# Patient Record
Sex: Male | Born: 1990 | Race: White | Hispanic: No | Marital: Married | Smoking: Never smoker
Health system: Southern US, Community
[De-identification: ages and names within clinical notes are randomized; demographics above are authoritative.]

## PROBLEM LIST (undated history)

## (undated) DIAGNOSIS — F419 Anxiety disorder, unspecified: Secondary | ICD-10-CM

## (undated) DIAGNOSIS — F32A Depression, unspecified: Secondary | ICD-10-CM

## (undated) DIAGNOSIS — U071 COVID-19: Secondary | ICD-10-CM

## (undated) HISTORY — DX: Depression, unspecified: F32.A

## (undated) HISTORY — DX: Anxiety disorder, unspecified: F41.9

---
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
## 2010-12-17 HISTORY — PX: WRIST SURGERY: SHX841

## 2011-01-07 ENCOUNTER — Encounter: Payer: Self-pay | Admitting: Specialist

## 2016-06-07 DIAGNOSIS — F4321 Adjustment disorder with depressed mood: Secondary | ICD-10-CM | POA: Insufficient documentation

## 2016-06-27 ENCOUNTER — Inpatient Hospital Stay (HOSPITAL_COMMUNITY)
Admission: AD | Admit: 2016-06-27 | Discharge: 2016-07-01 | DRG: 885 | Disposition: A | Discharge status: Home / Self Care | Payer: BLUE CROSS/BLUE SHIELD | Source: Intra-hospital | Attending: Psychiatry | Admitting: Psychiatry

## 2016-06-27 ENCOUNTER — Encounter (HOSPITAL_COMMUNITY): Payer: Self-pay | Admitting: Emergency Medicine

## 2016-06-27 ENCOUNTER — Emergency Department (HOSPITAL_COMMUNITY)
Admission: EM | Admit: 2016-06-27 | Discharge: 2016-06-27 | Disposition: A | Discharge status: Other Acute Inpatient Hospital | Payer: BLUE CROSS/BLUE SHIELD | Attending: Emergency Medicine | Admitting: Emergency Medicine

## 2016-06-27 ENCOUNTER — Encounter (HOSPITAL_COMMUNITY): Payer: Self-pay

## 2016-06-27 DIAGNOSIS — Z79899 Other long term (current) drug therapy: Secondary | ICD-10-CM | POA: Insufficient documentation

## 2016-06-27 DIAGNOSIS — R45851 Suicidal ideations: Secondary | ICD-10-CM | POA: Diagnosis present

## 2016-06-27 DIAGNOSIS — F332 Major depressive disorder, recurrent severe without psychotic features: Secondary | ICD-10-CM | POA: Diagnosis present

## 2016-06-27 DIAGNOSIS — F322 Major depressive disorder, single episode, severe without psychotic features: Secondary | ICD-10-CM | POA: Diagnosis present

## 2016-06-27 DIAGNOSIS — F329 Major depressive disorder, single episode, unspecified: Secondary | ICD-10-CM | POA: Diagnosis present

## 2016-06-27 DIAGNOSIS — F32A Depression, unspecified: Secondary | ICD-10-CM

## 2016-06-27 LAB — COMPREHENSIVE METABOLIC PANEL
ALT: 18 U/L (ref 17–63)
ANION GAP: 7 (ref 5–15)
AST: 17 U/L (ref 15–41)
Albumin: 5 g/dL (ref 3.5–5.0)
Alkaline Phosphatase: 44 U/L (ref 38–126)
BUN: 12 mg/dL (ref 6–20)
CHLORIDE: 105 mmol/L (ref 101–111)
CO2: 26 mmol/L (ref 22–32)
Calcium: 9.8 mg/dL (ref 8.9–10.3)
Creatinine, Ser: 0.9 mg/dL (ref 0.61–1.24)
GFR calc Af Amer: >60 mL/min (ref >60)
GFR calc non Af Amer: >60 mL/min (ref >60)
Glucose, Bld: 87 mg/dL (ref 65–99)
Potassium: 4.6 mmol/L (ref 3.5–5.1)
Sodium: 138 mmol/L (ref 135–145)
Total Bilirubin: 1.1 mg/dL (ref 0.3–1.2)
Total Protein: 7.9 g/dL (ref 6.5–8.1)

## 2016-06-27 LAB — RAPID URINE DRUG SCREEN, HOSP PERFORMED
AMPHETAMINES: NOT DETECTED
Barbiturates: NOT DETECTED
Benzodiazepines: NOT DETECTED
Cocaine: NOT DETECTED
Opiates: NOT DETECTED
TETRAHYDROCANNABINOL: NOT DETECTED

## 2016-06-27 LAB — SALICYLATE LEVEL: Salicylate Lvl: <4 mg/dL (ref 2.8–30.0)

## 2016-06-27 LAB — ETHANOL: Alcohol, Ethyl (B): <5 mg/dL (ref <5)

## 2016-06-27 LAB — ACETAMINOPHEN LEVEL: Acetaminophen (Tylenol), Serum: <10 ug/mL — ABNORMAL LOW (ref 10–30)

## 2016-06-27 MED ORDER — ESCITALOPRAM OXALATE 10 MG PO TABS
10.0000 mg | ORAL_TABLET | Freq: Every day | ORAL | Status: DC
  Administered 2016-06-28: 10 mg via ORAL
  Filled 2016-06-27 (×3): qty 1

## 2016-06-27 MED ORDER — TRAZODONE HCL 50 MG PO TABS
50.0000 mg | ORAL_TABLET | Freq: Every evening | ORAL | Status: DC | PRN
  Administered 2016-06-27 – 2016-06-30 (×5): 50 mg via ORAL
  Filled 2016-06-27 (×13): qty 1

## 2016-06-27 MED ORDER — ALUM & MAG HYDROXIDE-SIMETH 200-200-20 MG/5ML PO SUSP: 30.0000 mL | ORAL | Status: DC | PRN

## 2016-06-27 MED ORDER — MAGNESIUM HYDROXIDE 400 MG/5ML PO SUSP: 30.0000 mL | Freq: Every day | ORAL | Status: DC | PRN

## 2016-06-27 MED ORDER — TRAZODONE HCL 50 MG PO TABS
50.0000 mg | ORAL_TABLET | Freq: Every evening | ORAL | Status: DC | PRN
Start: 2016-06-28 — End: 2016-06-27

## 2016-06-27 MED ORDER — HYDROXYZINE HCL 25 MG PO TABS
25.0000 mg | ORAL_TABLET | Freq: Four times a day (QID) | ORAL | Status: DC | PRN
  Administered 2016-06-27 – 2016-06-30 (×4): 25 mg via ORAL
  Filled 2016-06-27 (×4): qty 1

## 2016-06-27 MED ORDER — ACETAMINOPHEN 325 MG PO TABS: 650.0000 mg | ORAL_TABLET | Freq: Four times a day (QID) | ORAL | Status: DC | PRN

## 2016-06-27 NOTE — BH Assessment (Addendum)
Tele Assessment Note   Charles Haynes is an 25 y.o. male.  -Clinician reviewed note by Dr. Rubin Payor.  Patient has been depressed for the last two months since breaking up with a girlfriend.  Reportedly she has been sleeping with other men since the breakup.  Patient has been on Lexapro for about a month now.  He has not had his dose today.  Pt told his mother that he was going to hang himself and he told nurse that he wanted to kill himself.   Mother said that patient had told his mother "I'm going to hang myself" about five times today.  He told mother that he had researched it and knew how to do it and that he was going to do it tonight.  Patient admits to saying these things.  He said he was suicidal.    Patient broke up w/ girlfriend two months ago.  He said he had planned to propose in August but something told him to hold off on the relationship.  Girlfriend wanted to move forward with the relationship but he did not so they split up.  Patient has been upset that she found someone quickly and reportedly has slept with this other person.  Pt lives in [facility] [place] (outside [place]) and has driven from there to [place] to see her and has done this about 5 times.  Patient over the last week or more has been saying he wants to die.  Today he saw a picture of the girlfriend and her new boyfriend on social media.  After that he said he was going to hang himself.  Patient used to work out a lot and exercise but for the last week he has lost his motivation.  Pt reports sleeping a lot and not wanting to do much.  He said that he thinks about the relationship constantly.  Patient denies use of ETOH or illicit drugs.  He is a Gaffer at [employer].  Pt has been depressed before about relationships.  He has had no inpatient psychiatric care history.  Patient did have some pastoral counseling about two years ago following a relationship breakup.  -Clinician discussed patient care with  Donell Sievert, PA.  He accepted patient to Dr. Jama Flavors.  Pt to go to [facility].  Patient disposition given to Dr. Rubin Payor.  Patient signed voluntary admission papers, they were faxed to [facility].  Nurse Autumn will contact [facility] and give report.  Diagnosis: MDD single episode, severe w/o psychosis  Past Medical History: History reviewed. No pertinent past medical history.  Past Surgical History  Procedure Laterality Date  . Knee surgery Left 2009  . Wrist surgery Right 2012    Family History: History reviewed. No pertinent family history.  Social History:  reports that he has never smoked. He does not have any smokeless tobacco history on file. He reports that he does not drink alcohol or use illicit drugs.  Additional Social History:  Alcohol / Drug Use Pain Medications: None Prescriptions: Lexapro 10mg  once daily at 17:30.  Has not taken today.  Pt began with this on 06/21 History of alcohol / drug use?: No history of alcohol / drug abuse  CIWA: CIWA-Ar BP: 118/78 mmHg Pulse Rate: 81 COWS:    PATIENT STRENGTHS: (choose at least two) Ability for insight Average or above average intelligence Capable of independent living Communication skills Supportive family/friends  Allergies: No Known Allergies  Home Medications:  (Not in a hospital admission)  OB/GYN Status:  No  LMP for male patient.  General Assessment Data [facility]: [facility] TTS Assessment: In system Is this a Tele or Face-to-Face Assessment?: Face-to-Face Is this an Initial Assessment or a Re-assessment for this encounter?: Initial Assessment Marital status: Single Is patient pregnant?: No Pregnancy Status: No Living Arrangements: Non-relatives/Friends ([place] Can pt return to current living arrangement?: Yes Admission Status: Voluntary Is patient capable of signing voluntary admission?: Yes Referral Source: Self/Family/Friend (Mother brought [facility] to [facility]) Insurance  type: BC/BS     Crisis Care Plan Living Arrangements: Non-relatives/Friends ([place] Name of Psychiatrist: None (Family provider is prescribing) Name of Therapist: None  Education Status Is patient currently in school?: Yes Current Grade: GafferGraduate student Highest grade of school patient has completed: Energy managerBachelor's  Name of school: [employer] Contact person: patient  Risk to self with the past 6 months Suicidal Ideation: Yes-Currently Present Has patient been a risk to self within the past 6 months prior to admission? : Yes Suicidal Intent: Yes-Currently Present Has patient had any suicidal intent within the past 6 months prior to admission? : Yes Is patient at risk for suicide?: Yes Suicidal Plan?: Yes-Currently Present Has patient had any suicidal plan within the past 6 months prior to admission? : Yes Specify Current Suicidal Plan: Hang self (had researched how to do it) Access to Means: Yes Specify Access to Suicidal Means: Rope at home What has been your use of drugs/alcohol within the last 12 months?: None Previous Attempts/Gestures: No How many times?: 0 Other Self Harm Risks: None Triggers for Past Attempts: None known Intentional Self Injurious Behavior: None Family Suicide History: No Recent stressful life event(s): Loss (Comment) (Broke up w/ gf two months ago.) Persecutory voices/beliefs?: No Depression: Yes Depression Symptoms: Despondent, Tearfulness, Loss of interest in usual pleasures, Feeling worthless/self pity, Fatigue, Isolating Substance abuse history and/or treatment for substance abuse?: No Suicide prevention information given to non-admitted patients: Not applicable  Risk to Others within the past 6 months Homicidal Ideation: No Does patient have any lifetime risk of violence toward others beyond the six months prior to admission? : No Thoughts of Harm to Others: No Current Homicidal Intent: No Current  Homicidal Plan: No Access to Homicidal Means: No Identified Victim: No one History of harm to others?: No Assessment of Violence: None Noted Violent Behavior Description: None noted Does patient have access to weapons?: No Criminal Charges Pending?: No Does patient have a court date: No Is patient on probation?: No  Psychosis Hallucinations: None noted Delusions: None noted  Mental Status Report Appearance/Hygiene: Unremarkable, In scrubs Eye Contact: Good Motor Activity: Freedom of movement, Unremarkable Speech: Soft Level of Consciousness: Alert Mood: Depressed, Helpless, Sad, Anxious Affect: Blunted, Depressed, Sad Anxiety Level: Moderate Thought Processes: Coherent, Relevant Judgement: Unimpaired Orientation: Person, Place, Time, Situation Obsessive Compulsive Thoughts/Behaviors: Severe (constantly thinking about former girlfriend)  Cognitive Functioning Concentration: Decreased Memory: Recent Intact, Remote Intact IQ: Average Insight: Good Impulse Control: Fair Appetite: Fair Weight Loss: 0 Weight Gain: 0 Sleep: Increased Total Hours of Sleep:  (10-12 hours per day.  Naps, full night sleep.) Vegetative Symptoms: Staying in bed, Decreased grooming  ADLScreening [facility] Assessment Services) Patient's cognitive ability adequate to safely complete daily activities?: Yes Patient able to express need for assistance with ADLs?: Yes Independently performs ADLs?: Yes (appropriate for developmental age)  Prior Inpatient Therapy Prior Inpatient Therapy: No Prior Therapy Dates: N/A Prior Therapy Facilty/Provider(s): N/A Reason for Treatment: N/A  Prior  Outpatient Therapy Prior Outpatient Therapy: Yes Prior Therapy Dates: couple of years ago Prior Therapy Facilty/Provider(s): Pastoral counseling Reason for Treatment: depression Does patient have an ACCT team?: No Does patient have Intensive In-House Services?  : No Does patient have [facility] services? : No Does  patient have P4CC services?: No  ADL Screening (condition at time of admission) Patient's cognitive ability adequate to safely complete daily activities?: Yes Is the patient deaf or have difficulty hearing?: No Does the patient have difficulty seeing, even when wearing glasses/contacts?: No Does the patient have difficulty concentrating, remembering, or making decisions?: No Patient able to express need for assistance with ADLs?: Yes Does the patient have difficulty dressing or bathing?: No Independently performs ADLs?: Yes (appropriate for developmental age) Does the patient have difficulty walking or climbing stairs?: No Weakness of Legs: None Weakness of Arms/Hands: None       Abuse/Neglect Assessment (Assessment to be complete while patient is alone) Physical Abuse: Denies Verbal Abuse: Denies Sexual Abuse: Denies Exploitation of patient/patient's resources: Denies Self-Neglect: Denies     Merchant navy officer (For Healthcare) Does patient have an advance directive?: No Would patient like information on creating an advanced directive?: No - patient declined information    Additional Information 1:1 In Past 12 Months?: No CIRT Risk: No Elopement Risk: No Does patient have medical clearance?: Yes     Disposition:  Disposition Initial Assessment Completed for this Encounter: Yes Disposition of Patient: Inpatient treatment program, Referred to Type of inpatient treatment program: Adult Patient referred to: Other (Comment) (To be reviewed with PA)  Beatriz Stallion Ray 06/27/2016 9:01 PM

## 2016-06-27 NOTE — Tx Team (Signed)
Initial Interdisciplinary Treatment Plan   PATIENT STRESSORS: Loss of Girlfriend   PATIENT STRENGTHS: Ability for insight Average or above average intelligence Capable of independent living Financial means General fund of knowledge   PROBLEM LIST: Problem List/Patient Goals Date to be addressed Date deferred Reason deferred Estimated date of resolution  Depression 06/27/16     Suicidal ideation 06/27/16     "I want to feel better" 06/27/16     "Be able to move on" 06/27/16                                    DISCHARGE CRITERIA:  Improved stabilization in mood, thinking, and/or behavior Verbal commitment to aftercare and medication compliance  PRELIMINARY DISCHARGE PLAN: Outpatient therapy Medication management  PATIENT/FAMIILY INVOLVEMENT: This treatment plan has been presented to and reviewed with the patient, Charles Haynes.  The patient and family have been given the opportunity to ask questions and make suggestions.  Norm ParcelHeather V Lilah Mijangos 06/27/2016, 11:09 PM

## 2016-06-27 NOTE — ED Notes (Signed)
Pt here for SI- brought by mother for SI. Mother states pt said to her today that he was going to hang himself and that he looked it up and knows how, and 5 mins prior to arrival of this RN pt said to mother "I am going to kill myself tonight and I am not going to tell you how and says he said it 5x today.  Pt takes Lexapro daily but did not have dose today. At triage pt with little words, cooperative but wants the mother to talk.

## 2016-06-27 NOTE — ED Provider Notes (Addendum)
CSN: [id number]     Arrival date & time 06/27/16  1846 History   First MD Initiated Contact with Patient 06/27/16 1918     Chief Complaint  Patient presents with  . Suicidal      The history is provided by the patient.  Patient was brought in with his mother for being suicidal. Reportedly has been depressed over the last 2 months since breaking up with a girlfriend. Reportedly she has been sleeping around with other men since then. He has been on Lexapro for around a month now. His continued to get worse over last few days and become suicidal. He is told his mother that he is suicidal. Told her that he would hang himself. He reported to me that he was suicidal. No history of depression before this. Denies substance abuse. He has reportedly in the seminary.  History reviewed. No pertinent past medical history. Past Surgical History  Procedure Laterality Date  . Knee surgery Left 2009  . Wrist surgery Right 2012   History reviewed. No pertinent family history. Social History  Substance Use Topics  . Smoking status: Never Smoker   . Smokeless tobacco: None  . Alcohol Use: No    Review of Systems  Constitutional: Negative for activity change and appetite change.  Eyes: Negative for pain.  Respiratory: Negative for chest tightness and shortness of breath.   Cardiovascular: Negative for chest pain and leg swelling.  Gastrointestinal: Negative for nausea, vomiting, abdominal pain and diarrhea.  Genitourinary: Negative for flank pain.  Musculoskeletal: Negative for back pain and neck stiffness.  Skin: Negative for rash.  Neurological: Negative for weakness, numbness and headaches.  Psychiatric/Behavioral: Positive for suicidal ideas and dysphoric mood. Negative for behavioral problems.      Allergies  Review of patient's allergies indicates no known allergies.  Home Medications   Prior to Admission medications   Not on File   BP 118/78 mmHg  Pulse 81  Temp(Src) 97.8 F  (36.6 C) (Oral)  Resp 16  Ht 5\' 11"  (1.803 m)  Wt 165 lb (74.844 kg)  BMI 23.02 kg/m2  SpO2 100% Physical Exam  Constitutional: He appears well-developed.  HENT:  Head: Atraumatic.  Eyes: EOM are normal.  Cardiovascular: Normal rate.   Pulmonary/Chest: Effort normal.  Abdominal: Soft.  Musculoskeletal: He exhibits no edema.  Neurological: He is alert.  Skin: Skin is warm.  Psychiatric:  Patient appear depressed    ED Course  Procedures (including critical care time) Labs Review Labs Reviewed  COMPREHENSIVE METABOLIC PANEL  ETHANOL  ACETAMINOPHEN LEVEL  SALICYLATE LEVEL  URINE RAPID DRUG SCREEN, HOSP PERFORMED    Imaging Review No results found. I have personally reviewed and evaluated these images and lab results as part of my medical decision-making.   EKG Interpretation None      MDM   Final diagnoses:  Suicidal ideations  Depression    Patient was brought in with his mother. He is depressed and suicidal since the breakup. Plan to hang himself. He is voluntary at this time but I would involuntary commitment he attempts to leave. Lab work pending but will likely be medically cleared. To be seen by TTS.     Benjiman CoreNathan Rahima Fleishman, MD 06/27/16 1936  Patient is transected at behavioral health by Dr. Jama Flavorsobos.  Benjiman CoreNathan Obaloluwa Delatte, MD 06/27/16 2119

## 2016-06-27 NOTE — Progress Notes (Signed)
Charles Haynes is a 25 year old male being [facility].  He was brought in with his mother for being suicidal. Reportedly has been depressed over the last 2 months since breaking up with a girlfriend. Reportedly she has been sleeping around with other men since then. He has been on Lexapro for around a month now and has continued to get worse over last few days. He told his mother that he would hang himself tonight.  No reports of psychiatric history. He denies substance abuse. 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  He continues to report suicidal ideation but is able to contract for safety on the unit.  He denies A/V hallucinations.  He denies medical problems.  Admission paperwork completed and signed.  Belongings searched and no belongs to secure in a locker.  Skin assessment completed and noted right wrist surgery scar and left knee surgery scar.  No other skin issues noted.  Q 15 minute checks initiated for safety.  We will monitor the progress towards his goals.]

## 2016-06-28 DIAGNOSIS — F332 Major depressive disorder, recurrent severe without psychotic features: Principal | ICD-10-CM

## 2016-06-28 DIAGNOSIS — R45851 Suicidal ideations: Secondary | ICD-10-CM | POA: Insufficient documentation

## 2016-06-28 MED ORDER — BUPROPION HCL ER (XL) 150 MG PO TB24
150.0000 mg | ORAL_TABLET | Freq: Every day | ORAL | Status: DC
  Administered 2016-06-28 – 2016-07-01 (×4): 150 mg via ORAL
  Filled 2016-06-28 (×7): qty 1

## 2016-06-28 MED ORDER — ONDANSETRON 4 MG PO TBDP
4.0000 mg | ORAL_TABLET | Freq: Three times a day (TID) | ORAL | Status: DC | PRN
  Administered 2016-06-28: 4 mg via ORAL
  Filled 2016-06-28: qty 1

## 2016-06-28 NOTE — [facility] Group Notes (Signed)
[facility] Group Notes:  (Nursing/MHT/Case Management/Adjunct)  Date:  06/28/2016  Time:  9:11 AM  Type of Therapy:  Orientation Group  Participation Level:  Did Not Attend  Patient invited; declined to attend.  Cranford MonBeaudry, Amory Simonetti Evans 06/28/2016, 9:11 AM

## 2016-06-28 NOTE — BHH Suicide Risk Assessment (Signed)
[facility] Admission Suicide Risk Assessment   Nursing information obtained from:  Patient Demographic factors:  Male, Adolescent or young adult, Caucasian Current Mental Status:  Suicidal ideation indicated by patient, Suicide plan Loss Factors:  Loss of significant relationship Historical Factors:  Impulsivity Risk Reduction Factors:  Employed, Religious beliefs about death, Positive social support  Total Time spent with patient: 45 minutes Principal Problem: <principal problem not specified> Diagnosis:   Patient Active Problem List   Diagnosis Date Noted  . MDD (major depressive disorder), single episode, severe (HCC) [F32.2] 06/27/2016   Subjective Data: Patient presented with severe depression and anxiety x 2 months and made several verbal threats to his parents and has intention and plan of hanging himself due to break up with his girl friend who has moved to other relationship and he still feels love with her. He has started Lexapro by a PCP and not helpful.   Continued Clinical Symptoms:  Alcohol Use Disorder Identification Test Final Score (AUDIT): 0 The "Alcohol Use Disorders Identification Test", Guidelines for Use in Primary Care, Second Edition.  World Science writer [facility]). Score between 0-7:  no or low risk or alcohol related problems. Score between 8-15:  moderate risk of alcohol related problems. Score between 16-19:  high risk of alcohol related problems. Score 20 or above:  warrants further diagnostic evaluation for alcohol dependence and treatment.   CLINICAL FACTORS:   Depression:   Anhedonia Hopelessness Impulsivity Insomnia Recent sense of peace/wellbeing Severe Unstable or Poor Therapeutic Relationship Previous Psychiatric Diagnoses and Treatments   Musculoskeletal: Strength & Muscle Tone: within normal limits Gait & Station: normal Patient leans: N/A  Psychiatric Specialty Exam: Physical Exam as per history and physical  ROS depression, loss of  interest and focus. Disturbed sleep and appetite. Denied chest pain and sob.  No Fever-chills, No Headache, No changes with Vision or hearing, reports vertigo No problems swallowing food or Liquids, No Chest pain, Cough or Shortness of Breath, No Abdominal pain, No Nausea or Vommitting, Bowel movements are regular, No Blood in stool or Urine, No dysuria, No new skin rashes or bruises, No new joints pains-aches,  No new weakness, tingling, numbness in any extremity, No recent weight gain or loss, No polyuria, polydypsia or polyphagia,  A full 10 point Review of Systems was done, except as stated above, all other Review of Systems were negative.  Blood pressure 99/59, pulse 91, temperature 98.1 F (36.7 C), temperature source Oral, resp. rate 16, height  (1.803 m), weight 74.844 kg (165 lb), SpO2 100 %.Body mass index is 23.02 kg/(m^2).  General Appearance: Guarded  Eye Contact:  Good  Speech:  Slow  Volume:  Decreased  Mood:  Anxious, Depressed, Hopeless and Worthless  Affect:  Constricted and Depressed  Thought Process:  Coherent and Goal Directed  Orientation:  Full (Time, Place, and Person)  Thought Content:  WDL and Logical  Suicidal Thoughts:  No  Homicidal Thoughts:  No  Memory:  Immediate;   Good Recent;   Fair Remote;   Good  Judgement:  Impaired  Insight:  Fair  Psychomotor Activity:  Decreased  Concentration:  Concentration: Good and Attention Span: Good  Recall:  Good  Fund of Knowledge:  Good  Language:  Good  Akathisia:  Negative  Handed:  Right  AIMS (if indicated):     Assets:  Communication Skills Desire for Improvement Financial Resources/Insurance Housing Leisure Time Physical Health Resilience Social Support Talents/Skills Transportation Vocational/Educational  ADL's:  Intact  Cognition:  WNL  Sleep:  Number of Hours: 5.5      COGNITIVE FEATURES THAT CONTRIBUTE TO RISK:  Closed-mindedness, Loss of executive function, Polarized  thinking and Thought constriction (tunnel vision)    SUICIDE RISK:   Moderate:  Frequent suicidal ideation with limited intensity, and duration, some specificity in terms of plans, no associated intent, good self-control, limited dysphoria/symptomatology, some risk factors present, and identifiable protective factors, including available and accessible social support.  PLAN OF CARE: Admit for crisis stabilization, safety monitoring and medication management for increased symptoms of depression with suicide ideation, intention and plan of hanging himself.   I certify that inpatient services furnished can reasonably be expected to improve the patient's condition.   Leata MouseJANARDHANA Johnta Couts, MD 06/28/2016, 10:40 AM

## 2016-06-28 NOTE — Tx Team (Addendum)
Interdisciplinary Treatment Plan Update (Adult) Date: 06/28/2016    Time Reviewed: 9:30 AM  Progress in Treatment: Attending groups: Continuing to assess, patient new to milieu Participating in groups: Continuing to assess, patient new to milieu Taking medication as prescribed: Yes Tolerating medication: Yes Family/Significant other contact made: No, CSW assessing for appropriate contacts Patient understands diagnosis: Yes Discussing patient identified problems/goals with staff: Yes Medical problems stabilized or resolved: Yes Denies suicidal/homicidal ideation: Yes Issues/concerns per patient self-inventory: Yes Other:  New problem(s) identified: N/A  Discharge Plan or Barriers: Home with outpatient services.   Reason for Continuation of Hospitalization:  Depression Anxiety Medication Stabilization   Comments: N/A  Estimated length of stay: 2-3 days    Patient is a 25 year old male who presented to the hospital with increased depression and SI with plan to hang self. Pt reports primary trigger(s) for admission was a recent break up. Patient will benefit from crisis stabilization, medication evaluation, group therapy and psycho education in addition to case management for discharge planning. At discharge, it is recommended that Pt remain compliant with established discharge plan and continued treatment.   Review of initial/current patient goals per problem list:  1. Goal(s): Patient will participate in aftercare plan   Met: Yes   Target date: 3-5 days post admission date   As evidenced by: Patient will participate within aftercare plan AEB aftercare provider and housing plan at discharge being identified.  7/13: Goal not met: CSW assessing for appropriate referrals for pt and will have follow up secured prior to d/c.  7/15: Goal met. Patient plans to return home to follow up with outpatient services.     2. Goal (s): Patient will exhibit decreased depressive  symptoms and suicidal ideations.   Met: No   Target date: 3-5 days post admission date   As evidenced by: Patient will utilize self rating of depression at 3 or below and demonstrate decreased signs of depression or be deemed stable for discharge by MD.  7/13: Goal not met: Pt presents with flat affect and depressed mood.  Pt admitted with depression rating of 10.  Pt to show decreased sign of depression and a rating of 3 or less before d/c.      Patient:    Family:    Physician: Dr. Parke Poisson 06/28/2016 9:30 AM  Nursing: Darrol Angel, Mayra Neer, RN 06/28/2016 9:30 AM  Clinical Social Worker: Tilden Fossa, LCSW 06/28/2016 9:30 AM  Other: Nira Conn Smart, LCSW ; Peri Maris LCSWA 06/28/2016 9:30 AM  Other:    Other:    Other: Agustina Caroli, Samuel Jester, NP 06/28/2016 9:30 AM  Other:               Scribe for Treatment Team:  Tilden Fossa, [place]

## 2016-06-28 NOTE — BHH Counselor (Signed)
Adult Comprehensive Assessment  Patient ID: Charles Haynes, male   DOB: April 16, 1991, 25 y.o.   MRN: [id number]  Information Source: Information source: Patient  Current Stressors:  Educational / Learning stressors: In [employer]inary [employer] [facility] [employer] Employment / Job issues: Works as a Paediatric nursechildren's pastor at [employer] Family Relationships: Reports good relationships with family Surveyor, quantityinancial / Lack of resources (include bankruptcy): Denies Housing / Lack of housing: [place] and [place] Physical health (include injuries & life threatening diseases): Denies Social relationships: Denies Substance abuse: Denies Bereavement / Loss: Recent break up about 2 months ago  Living/Environment/Situation:  Living Arrangements: Non-relatives/Friends, Parent Living conditions (as described by patient or guardian): [place] How long has patient lived in current situation?: 3 years What is atmosphere in current home: Comfortable, Supportive  Family History:  Marital status: Single Does patient have children?: No  Childhood History:  By whom was/is the patient raised?: Both parents Description of patient's relationship with caregiver when they were a child: Close with parents Patient's description of current relationship with people who raised him/her: Close with parents Does patient have siblings?: Yes Number of Siblings: 3 Description of patient's current relationship with siblings: Good relationship with 2 brothers and 1 sister Did patient suffer any verbal/emotional/physical/sexual abuse as a child?: No Did patient suffer from severe childhood neglect?: No Has patient ever been sexually abused/assaulted/raped as an adolescent or adult?: No Was the patient ever a victim of a crime or a disaster?: No Witnessed domestic violence?: No Has patient been effected by  domestic violence as an adult?: No  Education:  Highest grade of school patient has completed: Energy managerBachelor's  Currently a Consulting civil engineerstudent?: Yes Name of school: [employer] How long has the patient attended?: 3 years Learning disability?: No  Employment/Work Situation:   Employment situation: Employed Where is patient currently employed?: Works as a Paediatric nursechildren's pastor at [employer] How long has patient been employed?: 2 years Patient's job has been impacted by current illness: No What is the longest time patient has a held a job?: current job Has patient ever been in the Eli Lilly and Companymilitary?: No  Financial Resources:   Financial resources: Income from employment Does patient have a representative payee or guardian?: No  Alcohol/Substance Abuse:   What has been your use of drugs/alcohol within the last 12 months?: Denies If attempted suicide, did drugs/alcohol play a role in this?: No Alcohol/Substance Abuse Treatment Hx: Denies past history Has alcohol/substance abuse ever caused legal problems?: No  Social Support System:   Patient's Community Support System: Good Describe Community Support System: strong family support, close friend, professor Type of faith/religion: Ephriam KnucklesChristian How does patient's faith help to cope with current illness?: Finds it helpful but also feels disconnected from his faith due to his depression  Leisure/Recreation:   Leisure and Hobbies: spending time with friends  Strengths/Needs:   What things does the patient do well?: passionate about his profession In what areas does patient struggle / problems for patient: difficulty moving on from break up, depression, lack of motivation and energy, hopelessness  Discharge Plan:   Does patient have access to transportation?: Yes Will patient be returning to same living situation after discharge?: Yes Currently receiving community mental health services: Yes (From Whom) (PCP) If no, would patient like referral for services  when discharged?: Yes (What county?) ([facility] in [place]) Does patient have financial  barriers related to discharge medications?: No  Summary/Recommendations:   Summary and Recommendations (to be completed by the evaluator): Patient is a 25 year old male who presented to the hospital with increased depression and SI with plan to hang self. Pt reports primary trigger(s) for admission was a recent break up. Patient will benefit from crisis stabilization, medication evaluation, group therapy and psycho education in addition to case management for discharge planning. At discharge, it is recommended that Pt remain compliant with established discharge plan and continued treatment.  Charles Haynes, [place] 06/28/2016

## 2016-06-28 NOTE — Progress Notes (Signed)
D: Patient reports fair sleep and poor appetite.  Patient was asked to get up for medications this morning.  He stated, "I'm up.  I need my sleep to get better."  His goal today is to work on "my discouragement and try and attend all sessions.  Patient rates her depression as a 7; hopelessness as an 8; anxiety as a 6.  Patient has been lying in bed the majority of morning.  He reports low energy and poor concentration.  He presents with flat, blunted affect; sad and depressed mood.  He denies any thoughts of self harm.  He denies HI/AVH. A: Continue to monitor medication management and MD orders.  Safety checks completed every 15 minutes per protocol.  Offer support and encouragement as needed. R: Patient is isolative to room.

## 2016-06-28 NOTE — B[facility] Group Notes (Signed)
BHH LCSW Group Therapy 06/28/2016 1:15 PM Type of Therapy: Group Therapy Participation Level: Active  Participation Quality: Attentive, Sharing and Supportive  Affect: Depressed and Flat  Cognitive: Alert and Oriented  Insight: Developing/Improving and Engaged  Engagement in Therapy: Developing/Improving and Engaged  Modes of Intervention: Activity, Clarification, Confrontation, Discussion, Education, Exploration, Limit-setting, Orientation, Problem-solving, Rapport Building, Dance movement psychotherapisteality Testing, Socialization and Support  Summary of Progress/Problems: Patient was attentive and engaged with speaker from [employer]. Patient was attentive to speaker while they shared their story of dealing with mental health and overcoming it. Patient expressed interest in their programs and services and received information on their agency. Patient processed ways they can relate to the speaker.   Samuella BruinKristin Corinda Ammon, LCSW Clinical Social Worker [facility] [phone]

## 2016-06-28 NOTE — H&P (Signed)
Psychiatric Admission Assessment Adult  Patient Identification: Charles Haynes MRN:  [id number] Date of Evaluation:  06/28/2016 Chief Complaint:  MDD SINGLE EPISODE,SEVERE Principal Diagnosis: MDD (major depressive disorder), recurrent severe, without psychosis ([place]) Diagnosis:   Patient Active Problem List   Diagnosis Date Noted  . MDD (major depressive disorder), recurrent severe, without psychosis ([place]) [F33.2]     Priority: High  . Suicide ideation [R45.851]   . MDD (major depressive disorder), single episode, severe ([place]) [F32.2] 06/27/2016   History of Present Illness: Charles Haynes is an 25 y.o. Male. Was admitted for depression.  He states that he has had it worsen in the last couple months.  He also reports that in his teen he has suffered "transient" depression.  He has never sought mental health in the past.  Coinciding with a recent breakup with a GF for two years.  He states that he is a [place] and is not supposed to be full of rage but found himself enrages after seeing ex GF with another man.  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    He experienced similar type of depressed mood also from breaking up with a girlfriend.  However, he "just got over it.".  This last one however he described was different.  Patient sought the help of PCP wherein he was prescribed Celexa.  This affected him adversely and he became more suicidal.    He is pleasant and worried about his stay in [facility].  He is denying SI but is angry at his ex GF.    Associated Signs/Symptoms: Depression Symptoms:  depressed mood, anxiety, (Hypo) Manic Symptoms:  Irritable Mood, Anxiety Symptoms:  Excessive Worry, Psychotic Symptoms:  NA PTSD Symptoms: NA Total Time spent with patient: 45 minutes  Past Psychiatric History: see HPI  Is the patient at risk to self? Yes.    Has the patient been a risk to self in the past 6 months? Yes.    Has the patient been a risk to self within the distant  past? Yes.    Is the patient a risk to others? Yes.    Has the patient been a risk to others in the past 6 months? No.  Has the patient been a risk to others within the distant past? No.   Prior Inpatient Therapy:   Prior Outpatient Therapy:    Alcohol Screening: 1. How often do you have a drink containing alcohol?: Never 9. Have you or someone else been injured as a result of your drinking?: No 10. Has a relative or friend or a doctor or another health worker been concerned about your drinking or suggested you cut down?: No Alcohol Use Disorder Identification Test Final Score (AUDIT): 0 Brief Intervention: AUDIT score less than 7 or less-screening does not suggest unhealthy drinking-brief intervention not indicated Substance Abuse History in the last 12 months:  Yes.   Consequences of Substance Abuse: NA Previous Psychotropic Medications: Yes  Psychological Evaluations: Yes  Past Medical History: History reviewed. No pertinent past medical history.  Past Surgical History  Procedure Laterality Date  . Knee surgery Left 2009  . Wrist surgery Right 2012   Family History: History reviewed. No pertinent family history. Family Psychiatric  History: see HPI  Tobacco Screening: @FLOW ([id number])::1)@ Social History:  History  Alcohol Use No     History  Drug Use No    Additional Social History: Marital status: Single Does patient have children?: No    Pain Medications: None Prescriptions:  Lexapro 105m once daily at 17:30.  Has not taken today.  Pt began with this on 06/21 History of alcohol / drug use?: No history of alcohol / drug abuse                    Allergies:  No Known Allergies Lab Results:  Results for orders placed or performed during the hospital encounter of 06/27/16 (from the past 48 hour(s))  Urine rapid drug screen (hosp performed)     Status: None   Collection Time: 06/27/16  7:20 PM  Result Value Ref Range   Opiates NONE DETECTED NONE DETECTED    Cocaine NONE DETECTED NONE DETECTED   Benzodiazepines NONE DETECTED NONE DETECTED   Amphetamines NONE DETECTED NONE DETECTED   Tetrahydrocannabinol NONE DETECTED NONE DETECTED   Barbiturates NONE DETECTED NONE DETECTED    Comment:        DRUG SCREEN FOR MEDICAL PURPOSES ONLY.  IF CONFIRMATION IS NEEDED FOR ANY PURPOSE, NOTIFY LAB WITHIN 5 DAYS.        LOWEST DETECTABLE LIMITS FOR URINE DRUG SCREEN Drug Class       Cutoff (ng/mL) Amphetamine      1000 Barbiturate      200 Benzodiazepine   2480Tricyclics       3165Opiates          300 Cocaine          300 THC              50   Ethanol     Status: None   Collection Time: 06/27/16  8:18 PM  Result Value Ref Range   Alcohol, Ethyl (B) <5 <5 mg/dL    Comment:        LOWEST DETECTABLE LIMIT FOR SERUM ALCOHOL IS 5 mg/dL FOR MEDICAL PURPOSES ONLY   Acetaminophen level     Status: Abnormal   Collection Time: 06/27/16  8:18 PM  Result Value Ref Range   Acetaminophen (Tylenol), Serum <10 (L) 10 - 30 ug/mL    Comment:        THERAPEUTIC CONCENTRATIONS VARY SIGNIFICANTLY. A RANGE OF 10-30 ug/mL MAY BE AN EFFECTIVE CONCENTRATION FOR MANY PATIENTS. HOWEVER, SOME ARE BEST TREATED AT CONCENTRATIONS OUTSIDE THIS RANGE. ACETAMINOPHEN CONCENTRATIONS >150 ug/mL AT 4 HOURS AFTER INGESTION AND >50 ug/mL AT 12 HOURS AFTER INGESTION ARE OFTEN ASSOCIATED WITH TOXIC REACTIONS.   Salicylate level     Status: None   Collection Time: 06/27/16  8:18 PM  Result Value Ref Range   Salicylate Lvl <<5.32.8 - 30.0 mg/dL  Comprehensive metabolic panel     Status: None   Collection Time: 06/27/16  8:20 PM  Result Value Ref Range   Sodium 138 135 - 145 mmol/L   Potassium 4.6 3.5 - 5.1 mmol/L   Chloride 105 101 - 111 mmol/L   CO2 26 22 - 32 mmol/L   Glucose, Bld 87 65 - 99 mg/dL   BUN 12 6 - 20 mg/dL   Creatinine, Ser 0.90 0.61 - 1.24 mg/dL   Calcium 9.8 8.9 - 10.3 mg/dL   Total Protein 7.9 6.5 - 8.1 g/dL   Albumin 5.0 3.5 - 5.0 g/dL   AST  17 15 - 41 U/L   ALT 18 17 - 63 U/L   Alkaline Phosphatase 44 38 - 126 U/L   Total Bilirubin 1.1 0.3 - 1.2 mg/dL   GFR calc non Af Amer >60 >60 mL/min   GFR calc Af Amer >60 >  60 mL/min    Comment: (NOTE) The eGFR has been calculated using the CKD EPI equation. This calculation has not been validated in all clinical situations. eGFR's persistently <60 mL/min signify possible Chronic Kidney Disease.    Anion gap 7 5 - 15    Blood Alcohol level:  Lab Results  Component Value Date   ETH <5 67/[birth year]/1937    Metabolic Disorder Labs:  No results found for: HGBA1C, MPG No results found for: PROLACTIN No results found for: CHOL, TRIG, HDL, CHOLHDL, VLDL, LDLCALC  Current Medications: Current Facility-Administered Medications  Medication Dose Route Frequency Provider Last Rate Last Dose  . acetaminophen (TYLENOL) tablet 650 mg  650 mg Oral Q6H PRN Laverle Hobby, PA-C      . alum & mag hydroxide-simeth (MAALOX/MYLANTA) 200-200-20 MG/5ML suspension 30 mL  30 mL Oral Q4H PRN Laverle Hobby, PA-C      . escitalopram (LEXAPRO) tablet 10 mg  10 mg Oral Daily Laverle Hobby, PA-C   10 mg at 06/28/16 9024  . hydrOXYzine (ATARAX/VISTARIL) tablet 25 mg  25 mg Oral Q6H PRN Laverle Hobby, PA-C   25 mg at 06/27/16 2325  . magnesium hydroxide (MILK OF MAGNESIA) suspension 30 mL  30 mL Oral Daily PRN Laverle Hobby, PA-C      . ondansetron (ZOFRAN-ODT) disintegrating tablet 4 mg  4 mg Oral Q8H PRN Laverle Hobby, PA-C   4 mg at 06/28/16 0701  . traZODone (DESYREL) tablet 50 mg  50 mg Oral QHS,MR X 1 Spencer E Simon, PA-C   50 mg at 06/27/16 2325   PTA Medications: Prescriptions prior to admission  Medication Sig Dispense Refill Last Dose  . escitalopram (LEXAPRO) 10 MG tablet Take 10 mg by mouth daily.    06/26/2016 at Unknown time    Musculoskeletal: Strength & Muscle Tone: within normal limits Gait & Station: normal Patient leans: N/A  Psychiatric Specialty Exam: Physical Exam  Nursing  note and vitals reviewed.   Review of Systems  Psychiatric/Behavioral: Positive for depression. The patient is nervous/anxious.     Blood pressure 99/59, pulse 91, temperature 98.1 F (36.7 C), temperature source Oral, resp. rate 16, height 5' 11"  (1.803 m), weight 74.844 kg (165 lb), SpO2 100 %.Body mass index is 23.02 kg/(m^2).  General Appearance: Casual  Eye Contact:  Good  Speech:  Normal Rate  Volume:  Normal  Mood:  Anxious and Depressed  Affect:  Flat and Tearful  Thought Process:  Coherent  Orientation:  Full (Time, Place, and Person)  Thought Content:  Rumination  Suicidal Thoughts:  No  Homicidal Thoughts:  No  Memory:  Immediate;   Good Recent;   Good Remote;   Good  Judgement:  Impaired  Insight:  Lacking  Psychomotor Activity:  Normal  Concentration:  Concentration: Fair and Attention Span: Fair  Recall:  [employer] of Knowledge:  Fair  Language:  Fair  Akathisia:  Yes  Handed:  Right  AIMS (if indicated):     Assets:  Desire for Improvement Resilience  ADL's:  Intact  Cognition:  WNL  Sleep:  Number of Hours: 5.5   Treatment Plan Summary: Review of chart, vital signs, medications, and notes.  1-Individual and group therapy  2-Medication management for depression and anxiety: Medications reviewed with the patient.  Start Wellbutrin XL 150 mg mood sx, depression  3-Coping skills for depression, anxiety  4-Continue crisis stabilization and management  5-Address health issues--monitoring vital signs, stable  6-Treatment plan  in progress to prevent relapse of depression and anxiety  Observation Level/Precautions:  15 minute checks  Laboratory:  per ED  Psychotherapy:  group  Medications:  As per medlist  Consultations:  As needed  Discharge Concerns:  safety  Estimated LOS:  2-7 days  Other:     I certify that inpatient services furnished can reasonably be expected to improve the patient's condition.    Janett Labella, NP [facility] 7/13/20172:17  PM  Patient seen, chart reviewed and case discussed with the physician extender and formulated treatment plan. Patient endorses being depressed over 2 months and reports threatening to kill himself at least 5-6 times to his mother by hanging himself or cutting his wrist. Patient cannot contract for safety. Patient has been feeling dreaded since he saw the pictures and social media about his ex-girlfriend has been with somebody else and dating.  Completed admission suicide risk assessment and Reviewed the information documented and agree with the treatment plan.  [facility] 06/29/2016 12:27 PM

## 2016-06-29 NOTE — Progress Notes (Signed)
Adult Psychoeducational Group Note  Date:  06/29/2016 Time:  11:11 PM  Group Topic/Focus:  Wrap-Up Group:   The focus of this group is to help patients review their daily goal of treatment and discuss progress on daily workbooks.  Participation Level:  Active  Participation Quality:  Appropriate and Supportive  Affect:  Appropriate  Cognitive:  Alert, Appropriate and Oriented  Insight: Appropriate  Engagement in Group:  Engaged  Modes of Intervention:  Discussion  Additional Comments:  Patient articulated that his day started bad, but improved later because his Mother stopped by for a visit. Charles Haynes W Charles Haynes 06/29/2016, 11:11 PM

## 2016-06-29 NOTE — Progress Notes (Signed)
Recreation Therapy Notes  Date: 07.14.2017 Time: 9:30am Location: [address] Group Room   Group Topic: Stress Management  Goal Area(s) Addresses:  Patient will actively participate in stress management techniques presented during session.   Behavioral Response: Did not attend.   Elynore Dolinski L Kyeisha Janowicz, LRT/CTRS        Averie Hornbaker L 06/29/2016 5:56 PM 

## 2016-06-29 NOTE — BHH Group Notes (Signed)
B[facility] LCSW Aftercare Discharge Planning Group Note  06/29/2016  8:45 AM  Participation Quality: Did Not Attend. Patient invited to participate but declined.  Mennie Spiller, MSW, LCSW Clinical Social Worker [facility] [phone]   

## 2016-06-29 NOTE — Progress Notes (Signed)
Patient ID: Charles Haynes, male   DOB: 11-20-1991, 25 y.o.   MRN: [id number] D: Patient reports doing well and ready for discharge. Pt reports having time to think about his decisions prior to admission. Pt reports decreased anxiety and depressive symptoms. Pt denies SI/HI/AVH and pain. Pt attended evening karaoke group. Pt denies any needs or concerns.   A: Support and encouragement offered as needed. Medications administered as prescribed. Pt encouraged  to discuss feelings.   R: Patient cooperative and appropriate on unit. Will continue to monitor patient for safety and stability.

## 2016-06-29 NOTE — Progress Notes (Signed)
[facility] MD Progress Note  06/29/2016 6:05 PM Charles Haynes  MRN:  [id number] Subjective:  Patient reports he is feeling better, and at this time minimizes ongoing depression . Ruminates about recent break up with GF, and about how it seems to have affected him much more than it did her . Denies any suicidal ideations at this time, and denies any homicidal or violent ideations towards her . At this time not endorsing severe anger or rage, and presents calm, behavior in good control. Tends to ruminate about his feeling angry and disconcerted with God as to " why this happened ", regarding this recent loss ( patient is a  Contractor, currently in [employer], and states that he tends to see his daily life and life challenges from a spiritual angle )  At this time on Wellbutrin XL trial- denies side effects. Objective : I have discussed case with treatment team and have met with patient . He reports feeling better, and at this time does not endorse severe depression, sates he feels " all right " at this time. His major focus is being discharged soon. He has submitted a 72 hour letter requesting discharge. No disruptive or agitated behaviors on the unit. At this time tolerating Wellbutrin XL well , no side effects, we reviewed side effect profile, to include potential risk of seizures . Going to groups, visible on unit . At patient's request and in his presence I contacted his father via phone- father corroborated that patient seems to be improving, wanted to know about current medication management and disposition planning. Parents very supportive .  Principal Problem: MDD (major depressive disorder), recurrent severe, without psychosis ([place]) Diagnosis:   Patient Active Problem List   Diagnosis Date Noted  . MDD (major depressive disorder), recurrent severe, without psychosis ([place]) [F33.2]   . Suicide ideation [R45.851]   . MDD (major depressive disorder), single episode, severe ([place]) [F32.2]  06/27/2016   Total Time spent with patient: 20 minutes    Past Medical History: History reviewed. No pertinent past medical history.  Past Surgical History  Procedure Laterality Date  . Knee surgery Left 2009  . Wrist surgery Right 2012   Family History: History reviewed. No pertinent family history.  Social History:  History  Alcohol Use No     History  Drug Use No    Social History   Social History  . Marital Status: Single    Spouse Name: N/A  . Number of Children: N/A  . Years of Education: N/A   Social History Main Topics  . Smoking status: Never Smoker   . Smokeless tobacco: None  . Alcohol Use: No  . Drug Use: No  . Sexual Activity: Not Asked   Other Topics Concern  . None   Social History Narrative   Additional Social History:    Pain Medications: None Prescriptions: Lexapro 68m once daily at 17:30.  Has not taken today.  Pt began with this on 06/21 History of alcohol / drug use?: No history of alcohol / drug abuse  Sleep: Good  Appetite:  Good  Current Medications: Current Facility-Administered Medications  Medication Dose Route Frequency Provider Last Rate Last Dose  . acetaminophen (TYLENOL) tablet 650 mg  650 mg Oral Q6H PRN SLaverle Hobby PA-C      . alum & mag hydroxide-simeth (MAALOX/MYLANTA) 200-200-20 MG/5ML suspension 30 mL  30 mL Oral Q4H PRN SLaverle Hobby PA-C      . buPROPion (WELLBUTRIN XL) 24 hr tablet  150 mg  150 mg Oral Daily Kerrie Buffalo, NP   150 mg at 06/29/16 1036  . hydrOXYzine (ATARAX/VISTARIL) tablet 25 mg  25 mg Oral Q6H PRN Laverle Hobby, PA-C   25 mg at 06/28/16 2249  . magnesium hydroxide (MILK OF MAGNESIA) suspension 30 mL  30 mL Oral Daily PRN Laverle Hobby, PA-C      . ondansetron (ZOFRAN-ODT) disintegrating tablet 4 mg  4 mg Oral Q8H PRN Laverle Hobby, PA-C   4 mg at 06/28/16 0701  . traZODone (DESYREL) tablet 50 mg  50 mg Oral QHS,MR X 1 Laverle Hobby, PA-C   50 mg at 06/28/16 2249    Lab Results:   Results for orders placed or performed during the hospital encounter of 06/27/16 (from the past 48 hour(s))  Urine rapid drug screen (hosp performed)     Status: None   Collection Time: 06/27/16  7:20 PM  Result Value Ref Range   Opiates NONE DETECTED NONE DETECTED   Cocaine NONE DETECTED NONE DETECTED   Benzodiazepines NONE DETECTED NONE DETECTED   Amphetamines NONE DETECTED NONE DETECTED   Tetrahydrocannabinol NONE DETECTED NONE DETECTED   Barbiturates NONE DETECTED NONE DETECTED    Comment:        DRUG SCREEN FOR MEDICAL PURPOSES ONLY.  IF CONFIRMATION IS NEEDED FOR ANY PURPOSE, NOTIFY LAB WITHIN 5 DAYS.        LOWEST DETECTABLE LIMITS FOR URINE DRUG SCREEN Drug Class       Cutoff (ng/mL) Amphetamine      1000 Barbiturate      200 Benzodiazepine   409 Tricyclics       811 Opiates          300 Cocaine          300 THC              50   Ethanol     Status: None   Collection Time: 06/27/16  8:18 PM  Result Value Ref Range   Alcohol, Ethyl (B) <5 <5 mg/dL    Comment:        LOWEST DETECTABLE LIMIT FOR SERUM ALCOHOL IS 5 mg/dL FOR MEDICAL PURPOSES ONLY   Acetaminophen level     Status: Abnormal   Collection Time: 06/27/16  8:18 PM  Result Value Ref Range   Acetaminophen (Tylenol), Serum <10 (L) 10 - 30 ug/mL    Comment:        THERAPEUTIC CONCENTRATIONS VARY SIGNIFICANTLY. A RANGE OF 10-30 ug/mL MAY BE AN EFFECTIVE CONCENTRATION FOR MANY PATIENTS. HOWEVER, SOME ARE BEST TREATED AT CONCENTRATIONS OUTSIDE THIS RANGE. ACETAMINOPHEN CONCENTRATIONS >150 ug/mL AT 4 HOURS AFTER INGESTION AND >50 ug/mL AT 12 HOURS AFTER INGESTION ARE OFTEN ASSOCIATED WITH TOXIC REACTIONS.   Salicylate level     Status: None   Collection Time: 06/27/16  8:18 PM  Result Value Ref Range   Salicylate Lvl <9.1 2.8 - 30.0 mg/dL  Comprehensive metabolic panel     Status: None   Collection Time: 06/27/16  8:20 PM  Result Value Ref Range   Sodium 138 135 - 145 mmol/L   Potassium 4.6 3.5  - 5.1 mmol/L   Chloride 105 101 - 111 mmol/L   CO2 26 22 - 32 mmol/L   Glucose, Bld 87 65 - 99 mg/dL   BUN 12 6 - 20 mg/dL   Creatinine, Ser 0.90 0.61 - 1.24 mg/dL   Calcium 9.8 8.9 - 10.3 mg/dL   Total Protein 7.9  6.5 - 8.1 g/dL   Albumin 5.0 3.5 - 5.0 g/dL   AST 17 15 - 41 U/L   ALT 18 17 - 63 U/L   Alkaline Phosphatase 44 38 - 126 U/L   Total Bilirubin 1.1 0.3 - 1.2 mg/dL   GFR calc non Af Amer >60 >60 mL/min   GFR calc Af Amer >60 >60 mL/min    Comment: (NOTE) The eGFR has been calculated using the CKD EPI equation. This calculation has not been validated in all clinical situations. eGFR's persistently <60 mL/min signify possible Chronic Kidney Disease.    Anion gap 7 5 - 15    Blood Alcohol level:  Lab Results  Component Value Date   ETH <5 91/63/8466    Metabolic Disorder Labs: No results found for: HGBA1C, MPG No results found for: PROLACTIN No results found for: CHOL, TRIG, HDL, CHOLHDL, VLDL, LDLCALC  Physical Findings: AIMS: Facial and Oral Movements Muscles of Facial Expression: None, normal Lips and Perioral Area: None, normal Jaw: None, normal Tongue: None, normal,Extremity Movements Upper (arms, wrists, hands, fingers): None, normal Lower (legs, knees, ankles, toes): None, normal, Trunk Movements Neck, shoulders, hips: None, normal, Overall Severity Severity of abnormal movements (highest score from questions above): None, normal Incapacitation due to abnormal movements: None, normal Patient's awareness of abnormal movements (rate only patient's report): No Awareness, Dental Status Current problems with teeth and/or dentures?: No Does patient usually wear dentures?: No  CIWA:    COWS:     Musculoskeletal: Strength & Muscle Tone: within normal limits Gait & Station: normal Patient leans: N/A  Psychiatric Specialty Exam: Physical Exam  ROS no headaches, no chest pain , no shortness of breath, no nausea, no vomiting   Blood pressure 87/50, pulse  98, temperature 98.2 F (36.8 C), temperature source Oral, resp. rate 18, height 5' 11"  (1.803 m), weight 165 lb (74.844 kg), SpO2 100 %.Body mass index is 23.02 kg/(m^2).  General Appearance: Well Groomed  Eye Contact:  Good  Speech:  Normal Rate  Volume:  Normal  Mood:  improved mood, states he is feeling better, and at this time minimizes depression   Affect:  mildly constricted, but reactive and smiles at times appropriately   Thought Process:  Linear  Orientation:  Full (Time, Place, and Person)  Thought Content:  denies hallucinations, no delusions, not internally preoccupied   Suicidal Thoughts:  No- at this time denies any suicidal or self injurious ideations , contracts for safety on the unit   Homicidal Thoughts:  No- at this time denies any homicidal or violent ideations , also specifically denies any homicidal or violent ideations towards ex girlfriend   Memory:  recent and remote grossly intact   Judgement:  Other:  improving   Insight:  improving   Psychomotor Activity:  Normal  Concentration:  Concentration: Good and Attention Span: Good  Recall:  Good  Fund of Knowledge:  Good  Language:  Good  Akathisia:  Negative  Handed:  Right  AIMS (if indicated):     Assets:  Communication Skills Desire for Improvement Resilience  ADL's:  Intact  Cognition:  WNL  Sleep:  Number of Hours: 6.75   Assessment - patient reports improving mood and currently minimizes severe depression or significant neuro-vegetative symptoms. He denies any suicidal or self injurious ideations, he denies any violent or homicidal ideations, and specifically denies any violent ideations towards ex gf ( recent break up reported as major stressor ) . Currently tolerating Wellbutrin XL trial well .  Focused on being discharged soon and has submitted a letter requesting discharge.   Treatment Plan Summary: Daily contact with patient to assess and evaluate symptoms and progress in treatment, Medication  management, Plan inpatient admission  and medications as below Encourage group and milieu participation to work on coping skills and symptom reduction  Continue Wellbutrin XL 150 mgrs QAM for depression  Continue Hydroxyzine 25 mgrs Q 6 hours PRN for anxiety Continue Trazodone 50 mgrs QHS PRN for insomnia  Treatment team working on disposition planning options  Neita Garnet, MD 06/29/2016, 6:05 PM

## 2016-06-29 NOTE — Progress Notes (Signed)
D-  Patient denies SI, HI and AVH.  Patient stated that his mood has been better and that the medications have him thinking clearer.  Patient stated he thinks he is ready to discharge and would like to return to his normal life.  Patient has plans to attend school in the fall.   A- Assess patient for safety, offer medicaitons as prescribed, engage patient in 1:1 staff talks.   R- Continue to monitor as prescribed.

## 2016-06-29 NOTE — [facility] Suicide Risk Assessment (Addendum)
[facility] INPATIENT:  Family/Significant Other Suicide Prevention Education  Suicide Prevention Education:  Education Completed; parents Charles Haynes & Charles Haynes [phone],  (name of family member/significant other) has been identified by the patient as the family member/significant other with whom the patient will be residing, and identified as the person(s) who will aid the patient in the event of a mental health crisis (suicidal ideations/suicide attempt).  With written consent from the patient, the family member/significant other has been provided the following suicide prevention education, prior to the and/or following the discharge of the patient.  The suicide prevention education provided includes the following:  Suicide risk factors  Suicide prevention and interventions  National Suicide Hotline telephone number  [facility] assessment telephone number  [facility] Emergency Assistance 911  [facility] and/or Residential Mobile Crisis Unit telephone number  Request made of family/significant other to:  Remove weapons (e.g., guns, rifles, knives), all items previously/currently identified as safety concern.    Remove drugs/medications (over-the-counter, prescriptions, illicit drugs), all items previously/currently identified as a safety concern.  The family member/significant other verbalizes understanding of the suicide prevention education information provided.  The family member/significant other agrees to remove the items of safety concern listed above.  Charles Haynes, Charles Haynes 06/29/2016, 1:17 PM

## 2016-06-30 NOTE — Progress Notes (Signed)
    D: Pt was pleasant and cooperative, but appeared to have superficial conversations with the Clinical research associatewriter. Pt asked the writer if "this is her home and was the Clinical research associatewriter born and raised her'. Writer answered questions and redirected the questions back. Pt answered the questions and informed the writer that he was "ok" when asked. Pt has no other  questions or concerns.    A:  Support and encouragement was offered. 15 min checks continued for safety.  R: Pt remains safe.

## 2016-06-30 NOTE — [facility] Group Notes (Signed)
[facility] Group Notes:  (Nursing/MHT/Case Management/Adjunct)  Date:  06/30/2016  Time:  1100  Type of Therapy:  Nurse Education  :  Life SKills/  The group focuses on teaching patients how to identify their needs as well as identify healthy skills needed to get their needs met.  Participation Level:    Participation Quality:  Attentive  Affect:  Depressed  Cognitive:  Alert  Insight:  Appropriate  Engagement in Group:  Engaged  Modes of Intervention:  Discussion  Summary of Progress/Problems:  Charles Haynes 06/30/2016, 2:19 PM

## 2016-06-30 NOTE — Progress Notes (Signed)
Patient ID: Charles Haynes, male   DOB: 1991/09/21, 25 y.o.   MRN: [id number] [facility] MD Progress Note  06/30/2016 1:33 PM Charles Haynes  MRN:  [id number] Subjective:  Patient reports  he is feeling better and at this time is focused on being discharged soon. Continues to ruminate about recent break up, but to lesser degree than on admission. States that he plans to return to [employer] school and focus on continuing his career. At this time he is tolerating Wellbutrin trial well, denies side effects.    Objective : I have discussed case with treatment team and have met with patient . Patient reporting improved mood, and denies any suicidal or self injurious ideations. Presents with a reactive affect, and smiles at times appropriately. He does remain ruminative about recent stressor/ break up, but to lesser degree today. No disruptive or agitated behaviors on unit, staff reports patient required gentle redirection due to asking staff member  questions about  her personal life and upbringing  At this time patient pleasant, calm, well related. Thus far tolerating Wellbutrin XL trial well, denies medication side effects. He is looking forward to visit from his father later today, states that father was supposed to come visit him yesterday, but was delayed at airport so should be in today.    Principal Problem: MDD (major depressive disorder), recurrent severe, without psychosis ([place]) Diagnosis:   Patient Active Problem List   Diagnosis Date Noted  . MDD (major depressive disorder), recurrent severe, without psychosis ([place]) [F33.2]   . Suicide ideation [R45.851]   . MDD (major depressive disorder), single episode, severe ([place]) [F32.2] 06/27/2016   Total Time spent with patient: 20 minutes    Past Medical History: History reviewed. No pertinent past medical history.  Past Surgical History  Procedure Laterality Date  . Knee surgery Left 2009  . Wrist surgery Right 2012   Family  History: History reviewed. No pertinent family history.  Social History:  History  Alcohol Use No     History  Drug Use No    Social History   Social History  . Marital Status: Single    Spouse Name: N/A  . Number of Children: N/A  . Years of Education: N/A   Social History Main Topics  . Smoking status: Never Smoker   . Smokeless tobacco: None  . Alcohol Use: No  . Drug Use: No  . Sexual Activity: Not Asked   Other Topics Concern  . None   Social History Narrative   Additional Social History:    Pain Medications: None Prescriptions: Lexapro 92m once daily at 17:30.  Has not taken today.  Pt began with this on 06/21 History of alcohol / drug use?: No history of alcohol / drug abuse  Sleep: Good  Appetite:  Good  Current Medications: Current Facility-Administered Medications  Medication Dose Route Frequency Provider Last Rate Last Dose  . acetaminophen (TYLENOL) tablet 650 mg  650 mg Oral Q6H PRN SLaverle Hobby PA-C      . alum & mag hydroxide-simeth (MAALOX/MYLANTA) 200-200-20 MG/5ML suspension 30 mL  30 mL Oral Q4H PRN SLaverle Hobby PA-C      . buPROPion (WELLBUTRIN XL) 24 hr tablet 150 mg  150 mg Oral Daily SKerrie Buffalo NP   150 mg at 06/30/16 0901  . hydrOXYzine (ATARAX/VISTARIL) tablet 25 mg  25 mg Oral Q6H PRN SLaverle Hobby PA-C   25 mg at 06/29/16 2148  . magnesium hydroxide (MILK OF MAGNESIA) suspension 30  mL  30 mL Oral Daily PRN Laverle Hobby, PA-C      . ondansetron (ZOFRAN-ODT) disintegrating tablet 4 mg  4 mg Oral Q8H PRN Laverle Hobby, PA-C   4 mg at 06/28/16 0701  . traZODone (DESYREL) tablet 50 mg  50 mg Oral QHS,MR X 1 Laverle Hobby, PA-C   50 mg at 06/29/16 2148    Lab Results:  No results found for this or any previous visit (from the past 48 hour(s)).  Blood Alcohol level:  Lab Results  Component Value Date   ETH <5 20/94/7096    Metabolic Disorder Labs: No results found for: HGBA1C, MPG No results found for:  PROLACTIN No results found for: CHOL, TRIG, HDL, CHOLHDL, VLDL, LDLCALC  Physical Findings: AIMS: Facial and Oral Movements Muscles of Facial Expression: None, normal Lips and Perioral Area: None, normal Jaw: None, normal Tongue: None, normal,Extremity Movements Upper (arms, wrists, hands, fingers): None, normal Lower (legs, knees, ankles, toes): None, normal, Trunk Movements Neck, shoulders, hips: None, normal, Overall Severity Severity of abnormal movements (highest score from questions above): None, normal Incapacitation due to abnormal movements: None, normal Patient's awareness of abnormal movements (rate only patient's report): No Awareness, Dental Status Current problems with teeth and/or dentures?: No Does patient usually wear dentures?: No  CIWA:    COWS:     Musculoskeletal: Strength & Muscle Tone: within normal limits Gait & Station: normal Patient leans: N/A  Psychiatric Specialty Exam: Physical Exam  ROS no headaches, no chest pain , no shortness of breath, no nausea, no vomiting   Blood pressure 98/61, pulse 91, temperature 98 F (36.7 C), temperature source Oral, resp. rate 16, height 5' 11"  (1.803 m), weight 165 lb (74.844 kg), SpO2 100 %.Body mass index is 23.02 kg/(m^2).  General Appearance: Well Groomed  Eye Contact:  Good  Speech:  Normal Rate  Volume:  Normal  Mood:  Reports improved mood, at this time denies depression   Affect:  Remains vaguely constricted in affect, although does smile at times appropriately   Thought Process:  Linear  Orientation:  Full (Time, Place, and Person)  Thought Content:  denies hallucinations, no delusions, not internally preoccupied , still focused on recent relationship failure   Suicidal Thoughts:  No- at this time denies any suicidal or self injurious ideations , contracts for safety on the unit   Homicidal Thoughts:  No- at this time denies any homicidal or violent ideations , also specifically denies any homicidal or  violent ideations towards ex girlfriend   Memory:  recent and remote grossly intact   Judgement:  Other:  improving   Insight:  improving   Psychomotor Activity:  Normal  Concentration:  Concentration: Good and Attention Span: Good  Recall:  Good  Fund of Knowledge:  Good  Language:  Good  Akathisia:  Negative  Handed:  Right  AIMS (if indicated):     Assets:  Communication Skills Desire for Improvement Resilience  ADL's:  Intact  Cognition:  WNL  Sleep:  Number of Hours: 6.75   Assessment - patient describes improved mood, minimizes depression at this time, and is focused on being discharged soon . He does continue to ruminate and focus on recent break up, but states he is finally " accepting it for what it is, trying to move on". As noted, denies any violent or homicidal ideations towards ex GF or anyone else , and at this time denies suicidal ideations . Tolerating medication ( Wellbutrin XL) trial  well thus far- we have discussed side effects. Looking forward to visit from father later today.    Treatment Plan Summary: Daily contact with patient to assess and evaluate symptoms and progress in treatment, Medication management, Plan inpatient admission  and medications as below Encourage group and milieu participation to work on coping skills and symptom reduction  Continue Wellbutrin XL 150 mgrs QAM for depression  Continue Hydroxyzine 25 mgrs Q 6 hours PRN for anxiety Continue Trazodone 50 mgrs QHS PRN for insomnia  Treatment team working on disposition planning options  Neita Garnet, MD 06/30/2016, 1:33 PM

## 2016-06-30 NOTE — Progress Notes (Signed)
Charles Haynes is seen OOB [facility] on the [address] hall today..he is still sad but is able to share his feelings with this Clinical research associatewriter. He talks about what it was like to try to take his life. Now, he deneis HI and wants DESPERATELY  To reach his family.Marland Kitchen.aortic stenosis wll as his dead mother.A He remains  quiit   ,Sad and anxious  R Safety is in place.

## 2016-06-30 NOTE — [facility] Group Notes (Signed)
[facility] Group Notes:  (Nursing/MHT/Case Management/Adjunct)  Date:  06/30/2016  Time:  0900 Type of Therapy:  Nurse Education  :  Goals Group :   The group focuses  On teaching patients how to identify daily goals that will aide them in their recovery.  Participation Level:  Did Not Attend  Participation Quality:    Affect:    Cognitive:     Engagement in Group:    Modes of Intervention:    Summary of Progress/Problems:  Rich BraveDuke, Maryana Pittmon Lynn 06/30/2016, 10:27 AM

## 2016-06-30 NOTE — Progress Notes (Signed)
Adult Psychoeducational Group Note  Date:  06/30/2016 Time:  11:09 PM  Group Topic/Focus:  Wrap-Up Group:   The focus of this group is to help patients review their daily goal of treatment and discuss progress on daily workbooks.  Participation Level:  Active  Participation Quality:  Appropriate and Supportive  Affect:  Appropriate  Cognitive:  Alert, Appropriate and Oriented  Insight: Appropriate  Engagement in Group:  Engaged  Modes of Intervention:  Discussion and Support  Additional Comments:  Patient attend group and stated that his goal for today was to be discharged.  That goal was not accomplished.   Airianna Kreischer W Sharnell Knight 06/30/2016, 11:09 PM

## 2016-06-30 NOTE — [facility] Group Notes (Signed)
06/30/2016   Type of Therapy:  Group Therapy  Participation Level:  Did Not Attend   Charles Haynes J 06/30/2016,   

## 2016-06-30 NOTE — Progress Notes (Signed)
Nursing Note 06/30/2016 1900 - 07/01/16 0730  Data Patient being monitored inpatient for depression and SI with plan and intent to hang self (the day he was admitted).  Patient affect relaxed and appropriate to situation, mood "depressed."  Denies active SI, HI, and AVH, agrees to come to staff before acting on any thoughts to harm self or others.  Continues to be fixated on being discharged, 72 hour expires 07/01/16 at 1521.  Reports mild anxiety and continued depression.  Articulate, well mannered.  Interacting appropriate with staff and peers.  Visited with parents during visiting hours, patient states the visit went "well."  Requested PRN vistaril for mild anxiety and insomnia.  Action Spoke with patient 1:1 for about 5 minutes, offered to be a support throughout remainder of shift.  PRN vistaril given at bedtime with HS trazodone.  Remained on 15 minute checks for safety.  Response Remained safe on unit.  First trazodone/vistaril ineffective, needed repeat trazodone- given per request.  Patient seen shortly after administration in bed with eyes closed, even non-labored chest rises.

## 2016-07-01 MED ORDER — TRAZODONE HCL 50 MG PO TABS: 50.0000 mg | ORAL_TABLET | Freq: Every evening | ORAL | Status: DC | PRN

## 2016-07-01 MED ORDER — BUPROPION HCL ER (XL) 150 MG PO TB24: 150.0000 mg | ORAL_TABLET | Freq: Every day | ORAL | Status: DC

## 2016-07-01 MED ORDER — HYDROXYZINE HCL 25 MG PO TABS: 25.0000 mg | ORAL_TABLET | Freq: Four times a day (QID) | ORAL | Status: DC | PRN

## 2016-07-01 NOTE — B[facility] Suicide Risk Assessment (Addendum)
[facility] Discharge Suicide Risk Assessment   Principal Problem: MDD (major depressive disorder), recurrent severe, without psychosis (HCC) Discharge Diagnoses:  Patient Active Problem List   Diagnosis Date Noted  . MDD (major depressive disorder), recurrent severe, without psychosis (HCC) [F33.2]   . Suicide ideation [R45.851]   . MDD (major depressive disorder), single episode, severe (HCC) [F32.2] 06/27/2016    Total Time spent with patient: 30 minutes  Musculoskeletal: Strength & Muscle Tone: within normal limits Gait & Station: normal Patient leans: N/A  Psychiatric Specialty Exam: ROS denies headache, denies chest pain, no shortness of breath, no vomiting, no rash   Blood pressure 99/63, pulse 94, temperature 97.4 F (36.3 C), temperature source Oral, resp. rate 18, height  (1.803 m), weight 165 lb (74.844 kg), SpO2 100 %.Body mass index is 23.02 kg/(m^2).  General Appearance: Well Groomed  Eye Contact::  Good  Speech:  Normal Rate409  Volume:  Normal  Mood:  improved, at this time denies depression  Affect:  Appropriate and fuller in range   Thought Process:  Linear  Orientation:  Full (Time, Place, and Person)  Thought Content:  denies hallucinations, no delusions , not internally preoccupied   Suicidal Thoughts:  No- denies any suicidal or self injurious ideations, denies any homicidal or violent ideations, specifically denies any violent or homicidal ideations towards his ex gf   Homicidal Thoughts:  No   Memory:  recent and remote grossly intact   Judgement:  Other:  improved  Insight:  improving   Psychomotor Activity:  Normal  Concentration:  Good  Recall:  Good  Fund of Knowledge:Good  Language: Good  Akathisia:  Negative  Handed:  Right  AIMS (if indicated):     Assets:  Desire for Improvement Resilience  Sleep:  Number of Hours: 6.25  Cognition: WNL  ADL's:  Intact   Mental Status Per Nursing Assessment::   On Admission:  Suicidal ideation indicated  by patient, Suicide plan  Demographic Factors:  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 , Gaffer, living with parents currently   Loss Factors: Recent break up with gf   Historical Factors: No prior psychiatric admissions, no history of violence, no history of suicide attempts   Risk Reduction Factors:   Sense of responsibility to family, Religious beliefs about death, Employed, Positive social support and Positive coping skills or problem solving skills  Continued Clinical Symptoms:  At this time patient is alert and attentive, well groomed, good eye contact, speech normal, mood improved and at this time minimizes depression, affect appropriate, and full in range, no thought disorder, no suicidal or self injurious ideations, no homicidal ideations, future oriented, plans to return to live with his parents until he returns to university in August, 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 Denies medication side effects. With his express consent  and in his presence I spoke with his mother over the phone, who has been visiting regularly- she corroborates that patient seems much improved, and is in agreement with discharge. She reported patient has appointment tomorrow in AM at [facility] for IOP .  Cognitive Features That Contribute To Risk:  No gross cognitive deficits noted upon discharge. Is alert , attentive, and oriented x 3  Suicide Risk:  Mild:  Suicidal ideation of limited frequency, intensity, duration, and specificity.  There are no identifiable plans, no associated intent, mild dysphoria and related symptoms, good self-control (both objective and subjective assessment), few other risk factors, and identifiable protective  factors, including available and accessible social support.  Follow-up Information    [facility] On 07[facility]/2016.   Why:  Assessment for partial hospitalization day program on Monday July 17th at 8:30am.  [facility] (1st building you will pass on property). Bring your insurance card with you. Call if you need to reschedule.    Contact information:   [address] [place], [place] [address]  [phone]      Plan Of Care/Follow-up recommendations:  Activity:  as tolerated  Diet:  Regular Tests:  NA Other:  See below  Patient has requested discharge and there are no current grounds for involuntary commitment  Patient is leaving unit in good spirits  Plans to return home to parents  Plans to follow up as above Nehemiah MassedOBOS, FERNANDO, MD 07/01/2016, 10:01 AM

## 2016-07-01 NOTE — Discharge Summary (Signed)
Physician Discharge Summary Note  Patient:  Charles Haynes is an 25 y.o., male MRN:  [id number] DOB:  [birth year] Patient phone:  [phone] (home)  Patient address:   [address] [place] [address],  Total Time spent with patient: 30 minutes  Date of Admission:  06/27/2016 Date of Discharge: 07/01/2016  Reason for Admission:  Charles Haynes is an 25 y.o. Male. Was admitted for depression. He states that he has had it worsen in the last couple months. He also reports that in his teen he has suffered "transient" depression. He has never sought mental health in the past. Coinciding with a recent breakup with a GF for two years. He states that he is a [place] and is not supposed to be full of rage but found himself enrages after seeing ex GF with another man. 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 However, he "just got over it.". This last one however he described was different. Patient sought the help of PCP wherein he was prescribed Celexa.This affected him adversely and he became more suicidal.He is pleasant and worried about his stay in [facility]. He is denying SI but is angry at his ex GF.  Principal Problem: MDD (major depressive disorder), recurrent severe, without psychosis [facility]) Discharge Diagnoses: Patient Active Problem List   Diagnosis Date Noted  . MDD (major depressive disorder), recurrent severe, without psychosis (HCC) [F33.2]   . Suicide ideation [R45.851]   . MDD (major depressive disorder), single episode, severe (HCC) [F32.2] 06/27/2016    Past Psychiatric History: See Above  Past Medical History: History reviewed. No pertinent past medical history.  Past Surgical History  Procedure Laterality Date  . Knee surgery Left 2009  . Wrist surgery Right 2012   Family History: History reviewed. No pertinent family history. Family Psychiatric  History: See  Above Social History:  History  Alcohol Use No     History  Drug Use No    Social History   Social History  . Marital Status: Single    Spouse Name: N/A  . Number of Children: N/A  . Years of Education: N/A   Social History Main Topics  . Smoking status: Never Smoker   . Smokeless tobacco: None  . Alcohol Use: No  . Drug Use: No  . Sexual Activity: Not Asked   Other Topics Concern  . None   Social History Narrative    Hospital Course: Charles Haynes was admitted for MDD (major depressive disorder), recurrent severe, without psychosis (HCC)  and crisis management.  Pt was treated discharged with the medications listed below under Medication List.  Medical problems were identified and treated as needed.  Home medications were restarted as appropriate.  Improvement was monitored by observation and Charles Haynes 's daily report of symptom reduction.  Emotional and mental status was monitored by daily self-inventory reports completed by Charles Haynes and clinical staff.         Charles Haynes was evaluated by the treatment team for stability and plans for continued recovery upon discharge. Charles Haynes 's motivation was an integral factor for scheduling further treatment. Employment, transportation, bed availability, health status, family support, and any pending legal issues were also considered during hospital stay. Pt was offered further treatment options upon discharge including but not limited to Residential, Intensive Outpatient, and Outpatient treatment.  Charles Haynes will follow up with the services as listed below under Follow  Up Information.     Upon completion of this admission the patient was both mentally and medically stable for discharge denying suicidal/homicidal ideation, auditory/visual/tactile hallucinations, delusional thoughts and paranoia.    Charles PomfretNicholas Raymond responded well to treatment with Wellbutrin Xl 150mg  and without adverse effects. Initially,  pt did complain of side effects of Paxil with these medications, but this resolved after starting a different antidepressant.  Pt demonstrated improvement without reported or observed adverse effects to the point of stability appropriate for outpatient management. Pertinent labs include: for which outpatient follow-up is necessary for lab recheck as mentioned below. Reviewed CBC, CMP, BAL, and UDS; all unremarkable aside from noted exceptions.   Physical Findings: AIMS: Facial and Oral Movements Muscles of Facial Expression: None, normal Lips and Perioral Area: None, normal Jaw: None, normal Tongue: None, normal,Extremity Movements Upper (arms, wrists, hands, fingers): None, normal Lower (legs, knees, ankles, toes): None, normal, Trunk Movements Neck, shoulders, hips: None, normal, Overall Severity Severity of abnormal movements (highest score from questions above): None, normal Incapacitation due to abnormal movements: None, normal Patient's awareness of abnormal movements (rate only patient's report): No Awareness, Dental Status Current problems with teeth and/or dentures?: No Does patient usually wear dentures?: No  CIWA:    COWS:     Musculoskeletal: Strength & Muscle Tone: within normal limits Gait & Station: normal Patient leans: N/A  Psychiatric Specialty Exam: Physical Exam  Nursing note and vitals reviewed. Constitutional: He is oriented to person, place, and time. He appears well-developed.  HENT:  Head: Normocephalic.  Cardiovascular: Normal rate.   Musculoskeletal: Normal range of motion.  Neurological: He is alert and oriented to person, place, and time.  Psychiatric: He has a normal mood and affect. His behavior is normal.    Review of Systems  Psychiatric/Behavioral: Negative for suicidal ideas and hallucinations. Depression: stable. Nervous/anxious: stable.   All other systems reviewed and are negative.   Blood pressure 99/63, pulse 94, temperature 97.4 F (36.3  C), temperature source Oral, resp. rate 18, height 5\' 11"  (1.803 m), weight 74.844 kg (165 lb), SpO2 100 %.Body mass index is 23.02 kg/(m^2).   Have you used any form of tobacco in the last 30 days? (Cigarettes, Smokeless Tobacco, Cigars, and/or Pipes): No  Has this patient used any form of tobacco in the last 30 days? (Cigarettes, Smokeless Tobacco, Cigars, and/or Pipes) , No  Blood Alcohol level:  Lab Results  Component Value Date   ETH <5 06/27/2016    Metabolic Disorder Labs:  No results found for: HGBA1C, MPG No results found for: PROLACTIN No results found for: CHOL, TRIG, HDL, CHOLHDL, VLDL, LDLCALC  See Psychiatric Specialty Exam and Suicide Risk Assessment completed by Attending Physician prior to discharge.  Discharge destination:  Home  Is patient on multiple antipsychotic therapies at discharge:  No   Has Patient had three or more failed trials of antipsychotic monotherapy by history:  No  Recommended Plan for Multiple Antipsychotic Therapies: NA  Discharge Instructions    Activity as tolerated - No restrictions    Complete by:  As directed      Diet general    Complete by:  As directed      Discharge instructions    Complete by:  As directed   Take all medications as prescribed. Keep all follow-up appointments as scheduled.  Do not consume alcohol or use illegal drugs while on prescription medications. Report any adverse effects from your medications to your primary care provider promptly.  In the event  of recurrent symptoms or worsening symptoms, call 911, a crisis hotline, or go to the nearest emergency department for evaluation.            Medication List    STOP taking these medications        LEXAPRO 10 MG tablet  Generic drug:  escitalopram      TAKE these medications      Indication   buPROPion 150 MG 24 hr tablet  Commonly known as:  WELLBUTRIN XL  Take 1 tablet (150 mg total) by mouth daily.   Indication:  Major Depressive Disorder      hydrOXYzine 25 MG tablet  Commonly known as:  ATARAX/VISTARIL  Take 1 tablet (25 mg total) by mouth every 6 (six) hours as needed for anxiety.   Indication:  Anxiety Neurosis     traZODone 50 MG tablet  Commonly known as:  DESYREL  Take 1 tablet (50 mg total) by mouth at bedtime and may repeat dose one time if needed.   Indication:  Trouble Sleeping           Follow-up Information    [facility] up with [facility] On 07/02/2016.   Why:  Assessment for partial hospitalization day program on Monday July 17th at 8:30am. Please park at [facility] (1st building you will pass on property). Bring your insurance card with you. Call if you need to reschedule.    Contact information:   [address] [place], [place] [address]  [phone]      Follow-up recommendations:  Activity:  as tolerated Diet:  heart healthy as prdx by priamry care provided  Comments:  Take all medications as prescribed. Keep all follow-up appointments as scheduled.  Do not consume alcohol or use illegal drugs while on prescription medications. Report any adverse effects from your medications to your primary care provider promptly.  In the event of recurrent symptoms or worsening symptoms, call 911, a crisis hotline, or go to the nearest emergency department for evaluation.   Signed: Oneta Rack, NP 07/01/2016, 10:01 AM  Patient seen, Suicide Assessment Completed.  Disposition Plan Reviewed

## 2016-07-01 NOTE — Progress Notes (Signed)
  [facility] Discharge Plan :  Will you be returning to the same living situation after discharge:  Yes,  with Mom in family home At discharge, do you have transportation home?: Yes,  Mother Do you have the ability to pay for your medications: Yes,  on parent's insurance  Release of information consent forms completed and in the chart;  Patient's signature needed at discharge.  Patient to Follow up at: Follow-up Information    [facility] On 07/02/2016.   Why:  Assessment for partial hospitalization day program on Monday July 17th at 8:30am. Please [facility] at [facility] (1st building you will pass on property). Bring your insurance card with you. Call if you need to reschedule.    Contact information:   [address] [place], [place] [address]  [phone]      Next level of care provider has access to [facility] Link:no  Safety Planning and Suicide Prevention discussed: Yes,  By weekday CSW on 06/29/16 with both patient's parents  Have you used any form of tobacco in the last 30 days? (Cigarettes, Smokeless Tobacco, Cigars, and/or Pipes): No  Has patient been referred to the Quitline?: N/A patient is not a smoker, despite what other documentation may state Patient has been referred for addiction treatment: N/A  Clide DalesHarrill, Adea Geisel Campbell 07/01/2016, 1:29 PM

## 2016-07-01 NOTE — Progress Notes (Addendum)
Pt is given DC paperwork that includes DC AVS, DC transition form and DC SRA. Also, he is given a cc of the suicide safety plan he created, as well ( original placed on shadow chart). ALL paperwork is reviewed with pt, he demonstrates verbal understanding and he states he understands and will comply . He states he feels " like I'm in a much better place  now..."

## 2016-07-01 NOTE — [facility] Group Notes (Signed)
[facility] LCSW Group Therapy  07/01/2016   Type of Therapy:  Group Therapy  Participation Level:  None  Participation Quality:  Inattentive  Affect:  Appropriate  Cognitive:  Oriented  Insight:  None  Engagement in Therapy:  None  Modes of Intervention:  Discussion  Summary of Progress/Problems: Group discussed rights and responsibilities. Participants were able to identify that rights are things that you have the 'freedom' to do and that are 'privileges'. They also identified that they can be 'selfish' and 'abused'. Participants also discussed responsibilities. They identified these as things that you have to do or your commitments. Facilitator focused discussion around our mental health care. The right to choose wellness and the responsibility to stick to a wellness plan. Group further discussed the impact of others and self-checks in order to support the responsibility of managing the right to wellness. Patient did not engage or participate in group but was present throughout group.   Charles SessionsLINDSEY, Charles Haynes 07/01/2016, 12:04 PM

## 2016-07-01 NOTE — [facility] Group Notes (Signed)
[facility] Group Notes:  (Nursing/MHT/Case Management/Adjunct)  Date:  07/01/2016  Time:  0900  Type of Therapy:  Nurse Education  /  Healthy Support Systems:  The group focused on teaching patients how to identify healthy support systems in their lives and how to utilize these to maintain their recovery.  Participation Level:  Active  Participation Quality:  Appropriate  Affect:  Appropriate  Cognitive:  Alert  Insight:  Good  Engagement in Group:  Engaged  Modes of Intervention:  Education  Summary of Progress/Problems:  Rich BraveDuke, Macaiah Mangal Lynn 07/01/2016, 10:09 AM

## 2017-09-02 ENCOUNTER — Encounter (HOSPITAL_COMMUNITY): Payer: Self-pay | Admitting: Emergency Medicine

## 2017-09-02 ENCOUNTER — Ambulatory Visit (HOSPITAL_COMMUNITY)
Admission: EM | Admit: 2017-09-02 | Discharge: 2017-09-02 | Disposition: A | Discharge status: Home / Self Care | Payer: BLUE CROSS/BLUE SHIELD | Attending: Family Medicine | Admitting: Family Medicine

## 2017-09-02 DIAGNOSIS — J069 Acute upper respiratory infection, unspecified: Secondary | ICD-10-CM | POA: Diagnosis not present

## 2017-09-02 DIAGNOSIS — R0982 Postnasal drip: Secondary | ICD-10-CM | POA: Diagnosis not present

## 2017-09-02 DIAGNOSIS — R45851 Suicidal ideations: Secondary | ICD-10-CM | POA: Insufficient documentation

## 2017-09-02 DIAGNOSIS — Z791 Long term (current) use of non-steroidal anti-inflammatories (NSAID): Secondary | ICD-10-CM | POA: Insufficient documentation

## 2017-09-02 DIAGNOSIS — R509 Fever, unspecified: Secondary | ICD-10-CM | POA: Insufficient documentation

## 2017-09-02 DIAGNOSIS — J029 Acute pharyngitis, unspecified: Secondary | ICD-10-CM

## 2017-09-02 DIAGNOSIS — F332 Major depressive disorder, recurrent severe without psychotic features: Secondary | ICD-10-CM | POA: Diagnosis not present

## 2017-09-02 DIAGNOSIS — Z9889 Other specified postprocedural states: Secondary | ICD-10-CM | POA: Diagnosis not present

## 2017-09-02 LAB — POCT RAPID STREP A: Streptococcus, Group A Screen (Direct): NEGATIVE

## 2017-09-02 NOTE — Discharge Instructions (Signed)
Recommend taking Sudafed PE 10 mg every 4 hours as needed for congestion, Zyrtec or Allegra for nasal drainage or drainage of the back of the throat. Continue ibuprofen as needed. For any worsening or new symptoms may return.

## 2017-09-02 NOTE — ED Triage Notes (Signed)
Onset wed of a fever.  Since onset, patient complain of congestion and headache

## 2017-09-02 NOTE — ED Provider Notes (Signed)
[facility]    CSN: [id number] Arrival date & time: 09/02/17  1546     History   Chief Complaint Chief Complaint  Patient presents with  . Fever    HPI Charles Haynes is a 26 y.o. male.   80 y o  male states that he has had a subjective fever intermittently for the past 4-5 days. In addition nasal congestion and headaches and a nighttime chills. Denies shortness of breath, chest pain, abdominal pain, GU symptoms.      History reviewed. No pertinent past medical history.  Patient Active Problem List   Diagnosis Date Noted  . MDD (major depressive disorder), recurrent severe, without psychosis (HCC)   . Suicide ideation   . MDD (major depressive disorder), single episode, severe (HCC) 06/27/2016    Past Surgical History:  Procedure Laterality Date  . KNEE SURGERY Left 2009  . WRIST SURGERY Right 2012       Home Medications    Prior to Admission medications   Medication Sig Start Date End Date Taking? Authorizing Provider  ibuprofen (ADVIL,MOTRIN) 200 MG tablet Take 200 mg by mouth every 6 (six) hours as needed.   Yes [provider]    Family History No family history on file.  Social History Social History  Substance Use Topics  . Smoking status: Never Smoker  . Smokeless tobacco: Not on file  . Alcohol use No     Allergies   Patient has no known allergies.   Review of Systems Review of Systems  Constitutional: Positive for activity change and fever. Negative for diaphoresis and fatigue.  HENT: Positive for congestion. Negative for ear pain, facial swelling, postnasal drip, rhinorrhea, sore throat and trouble swallowing.   Eyes: Negative for pain, discharge and redness.  Respiratory: Negative for cough, chest tightness and shortness of breath.   Cardiovascular: Negative.   Gastrointestinal: Negative.   Musculoskeletal: Negative.  Negative for neck pain and neck stiffness.  Neurological: Negative.   All other systems  reviewed and are negative.    Physical Exam Triage Vital Signs ED Triage Vitals  Enc Vitals Group     BP 09/02/17 1641 106/60     Pulse Rate 09/02/17 1641 80     Resp 09/02/17 1641 18     Temp 09/02/17 1641 98.6 F (37 C)     Temp Source 09/02/17 1641 Oral     SpO2 09/02/17 1641 100 %     Weight --      Height --      Head Circumference --      Peak Flow --      Pain Score 09/02/17 1639 6     Pain Loc --      Pain Edu? --      Excl. in GC? --    No data found.   Updated Vital Signs BP 106/60 (BP Location: Left Arm)   Pulse 80   Temp 98.6 F (37 C) (Oral)   Resp 18   SpO2 100%   Visual Acuity Right Eye Distance:   Left Eye Distance:   Bilateral Distance:    Right Eye Near:   Left Eye Near:    Bilateral Near:     Physical Exam  Constitutional: He is oriented to person, place, and time. He appears well-developed and well-nourished. No distress.  HENT:  Head: Normocephalic and atraumatic.  Mouth/Throat: No oropharyngeal exudate.  Bilateral TMs are normal. Oropharynx with much cobblestoning. No drainage seen.  Neck: Normal range  of motion. Neck supple.  Bilateral anterior cervical adenopathy  Cardiovascular: Normal rate, regular rhythm and normal heart sounds.   Pulmonary/Chest: Effort normal and breath sounds normal. No respiratory distress. He has no wheezes.  Musculoskeletal: Normal range of motion. He exhibits no edema.  Lymphadenopathy:    He has cervical adenopathy.  Neurological: He is alert and oriented to person, place, and time.  Skin: Skin is warm and dry. No rash noted.  Psychiatric: He has a normal mood and affect.  Nursing note and vitals reviewed.    UC Treatments / Results  Labs (all labs ordered are listed, but only abnormal results are displayed) Labs Reviewed  POCT RAPID STREP A    EKG  EKG Interpretation None       Radiology No results found.  Procedures Procedures (including critical care time)  Medications Ordered  in UC Medications - No data to display   Initial Impression / Assessment and Plan / UC Course  I have reviewed the triage vital signs and the nursing notes.  Pertinent labs & imaging results that were available during my care of the patient were reviewed by me and considered in my medical decision making (see chart for details).    Recommend taking Sudafed PE 10 mg every 4 hours as needed for congestion, Zyrtec or Allegra for nasal drainage or drainage of the back of the throat. Continue ibuprofen as needed. For any worsening or new symptoms may return.    Final Clinical Impressions(s) / UC Diagnoses   Final diagnoses:  Viral upper respiratory tract infection  PND (post-nasal drip)    New Prescriptions Current Discharge Medication List       Controlled Substance Prescriptions [place] Controlled Substance Registry consulted? Not Applicable   Hayden Rasmussen, NP 09/02/17 [phone]

## 2017-09-05 LAB — CULTURE, GROUP A STREP (THRC)

## 2020-06-18 ENCOUNTER — Ambulatory Visit (INDEPENDENT_AMBULATORY_CARE_PROVIDER_SITE_OTHER): Payer: 59

## 2020-06-18 ENCOUNTER — Encounter (HOSPITAL_COMMUNITY): Payer: Self-pay | Admitting: *Deleted

## 2020-06-18 ENCOUNTER — Ambulatory Visit (HOSPITAL_COMMUNITY)
Admission: EM | Admit: 2020-06-18 | Discharge: 2020-06-18 | Disposition: A | Discharge status: Home / Self Care | Payer: 59 | Attending: Family Medicine | Admitting: Family Medicine

## 2020-06-18 ENCOUNTER — Other Ambulatory Visit: Payer: Self-pay

## 2020-06-18 DIAGNOSIS — R0602 Shortness of breath: Secondary | ICD-10-CM | POA: Diagnosis not present

## 2020-06-18 DIAGNOSIS — U071 COVID-19: Secondary | ICD-10-CM

## 2020-06-18 HISTORY — DX: COVID-19: U07.1

## 2020-06-18 NOTE — ED Triage Notes (Signed)
Per pt; had Covid dx 5 days ago with low-grade fever and intermittent mild SOB.  States SOB much worse today; started with cough today. C/O feeling "tingling in my chest when I breathe heavily".

## 2020-06-20 NOTE — ED Provider Notes (Signed)
[facility]   [id number] 06/18/20 Arrival Time: 1508  ASSESSMENT & PLAN:  1. COVID-19 virus infection   2. SOB (shortness of breath)      I have personally viewed the imaging studies ordered this visit. CXR without PNA.  OTC symptom care as needed.    Follow-up Information    [facility] at [facility].   Specialty: Urgent Care Why: If worsening or failing to improve as anticipated. Contact information: [address] [place] [address] [phone]              Reviewed expectations re: course of current medical issues. Questions answered. Outlined signs and symptoms indicating need for more acute intervention. Understanding verbalized. After Visit Summary given.   SUBJECTIVE: History from: patient. Charles Haynes is a 29 y.o. male who was dx with COVID 5 d ago. Persistent low-grade temp. Now with mild SOB at times. Coughing. Fatigued. Normal PO intake without n/v/d.    OBJECTIVE:  Vitals:   06/18/20 1534  BP: 122/70  Pulse: 92  Resp: (!) 24  Temp: 99.3 F (37.4 C)  TempSrc: Oral  SpO2: 98%    General appearance: alert; no distress Eyes: PERRLA; EOMI; conjunctiva normal HENT: [place]; AT; nasal mucosa normal; oral mucosa normal Neck: supple  Lungs: speaks full sentences without difficulty; unlabored; CTAB (recheck RR 18) Extremities: no edema Skin: warm and dry Neurologic: normal gait Psychological: alert and cooperative; normal mood and affect  Imaging: DG Chest 2 View  Result Date: 06/18/2020 CLINICAL DATA:  29 year old male with shortness of breath. EXAM: CHEST - 2 VIEW COMPARISON:  None. FINDINGS: The heart size and mediastinal contours are within normal limits. Both lungs are clear. The visualized skeletal structures are unremarkable. IMPRESSION: No active cardiopulmonary disease. Electronically Signed   By: Elgie Collard M.D.   On: 06/18/2020 16:31    No Known Allergies  Past Medical History:    Diagnosis Date  . COVID-19    Social History   Socioeconomic History  . Marital status: Married    Spouse name: Not on file  . Number of children: Not on file  . Years of education: Not on file  . Highest education level: Not on file  Occupational History  . Not on file  Tobacco Use  . Smoking status: Never Smoker  . Smokeless tobacco: Never Used  Vaping Use  . Vaping Use: Never used  Substance and Sexual Activity  . Alcohol use: No  . Drug use: No  . Sexual activity: Not on file  Other Topics Concern  . Not on file  Social History Narrative  . Not on file   Social Determinants of Health   Financial Resource Strain:   . Difficulty of Paying Living Expenses:   Food Insecurity:   . Worried About Programme researcher, broadcasting/film/video in the Last Year:   . Barista in the Last Year:   Transportation Needs:   . Freight forwarder (Medical):   Marland Kitchen Lack of Transportation (Non-Medical):   Physical Activity:   . Days of Exercise per Week:   . Minutes of Exercise per Session:   Stress:   . Feeling of Stress :   Social Connections:   . Frequency of Communication with Friends and Family:   . Frequency of Social Gatherings with Friends and Family:   . Attends Religious Services:   . Active Member of Clubs or Organizations:   . Attends Banker Meetings:   .  Marital Status:   Intimate Partner Violence:   . Fear of Current or Ex-Partner:   . Emotionally Abused:   Marland Kitchen Physically Abused:   . Sexually Abused:    Family History  Problem Relation Age of Onset  . Healthy Mother   . Healthy Father    Past Surgical History:  Procedure Laterality Date  . KNEE SURGERY Left 2009  . WRIST SURGERY Right 2012     Mardella Layman, MD 06/20/20 [phone]

## 2021-12-22 IMAGING — DX DG CHEST 2V
2 series · 2 of 2 positions shown · non-contrast
Comparison: None.

CLINICAL DATA: 29-year-old male with shortness of breath.

EXAM:
CHEST - 2 VIEW

[chest pa]
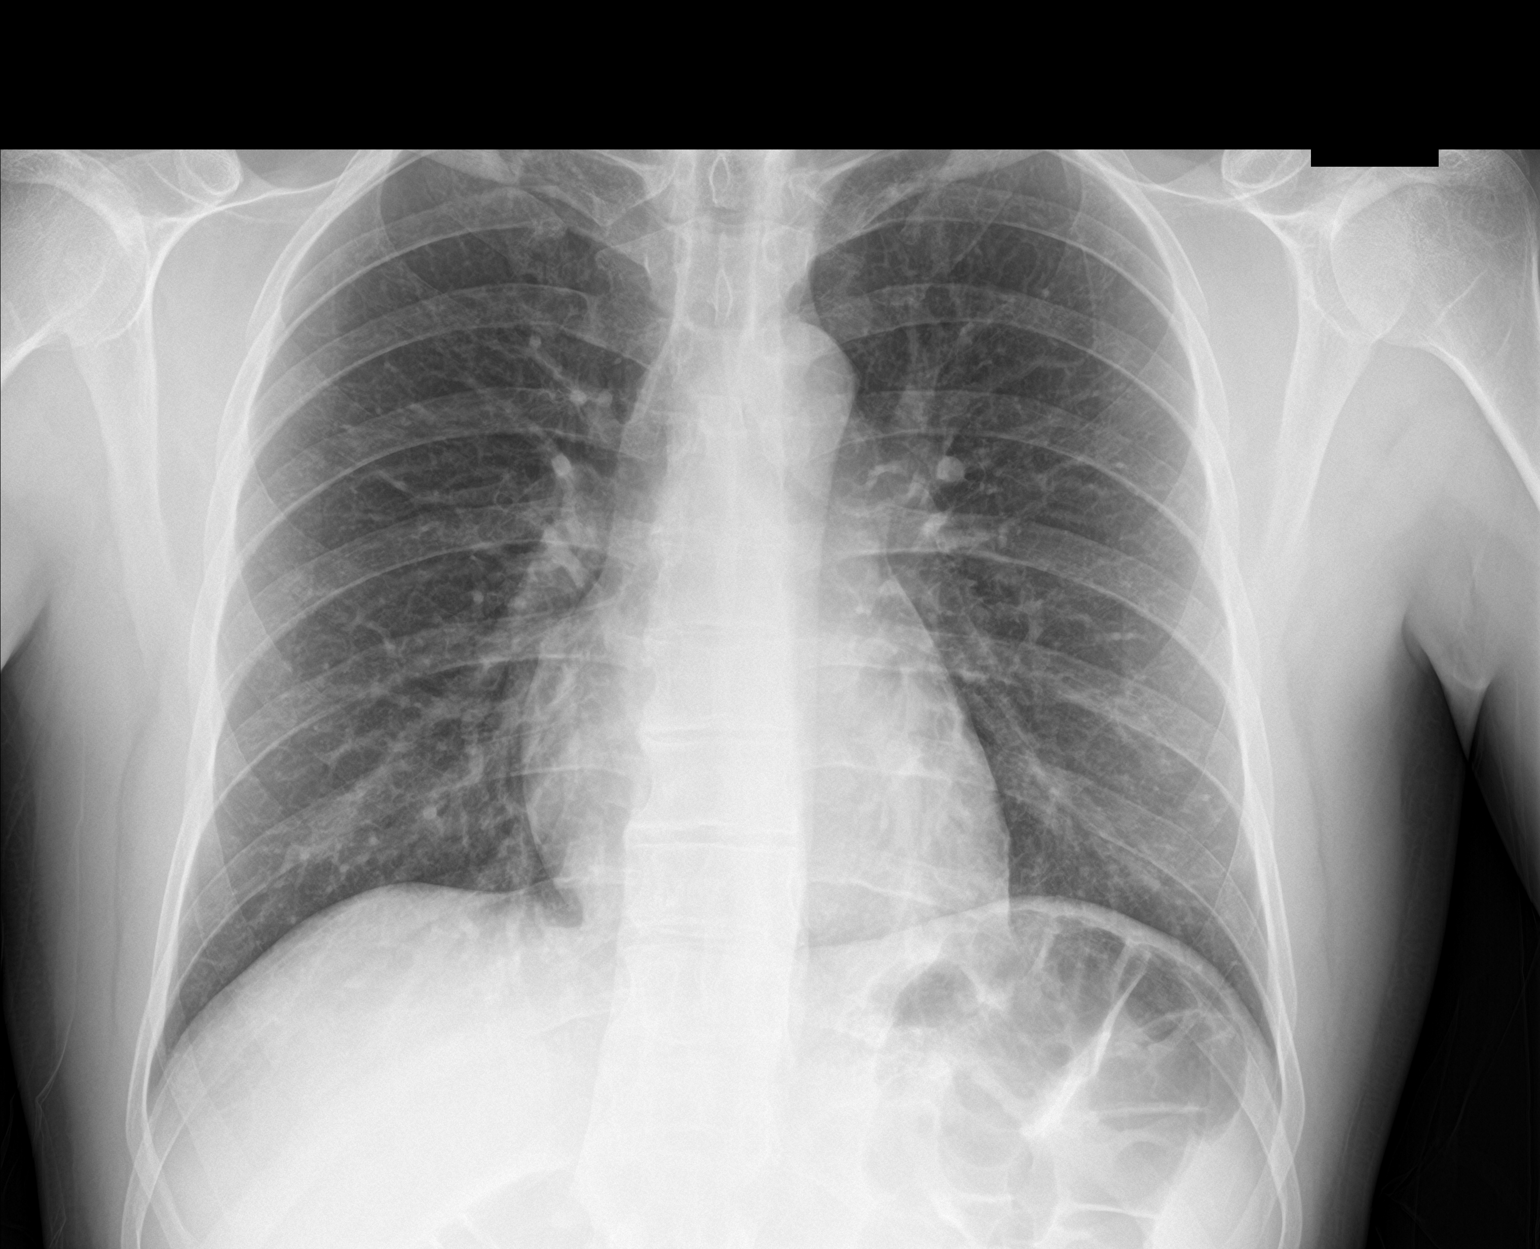

[chest lat]
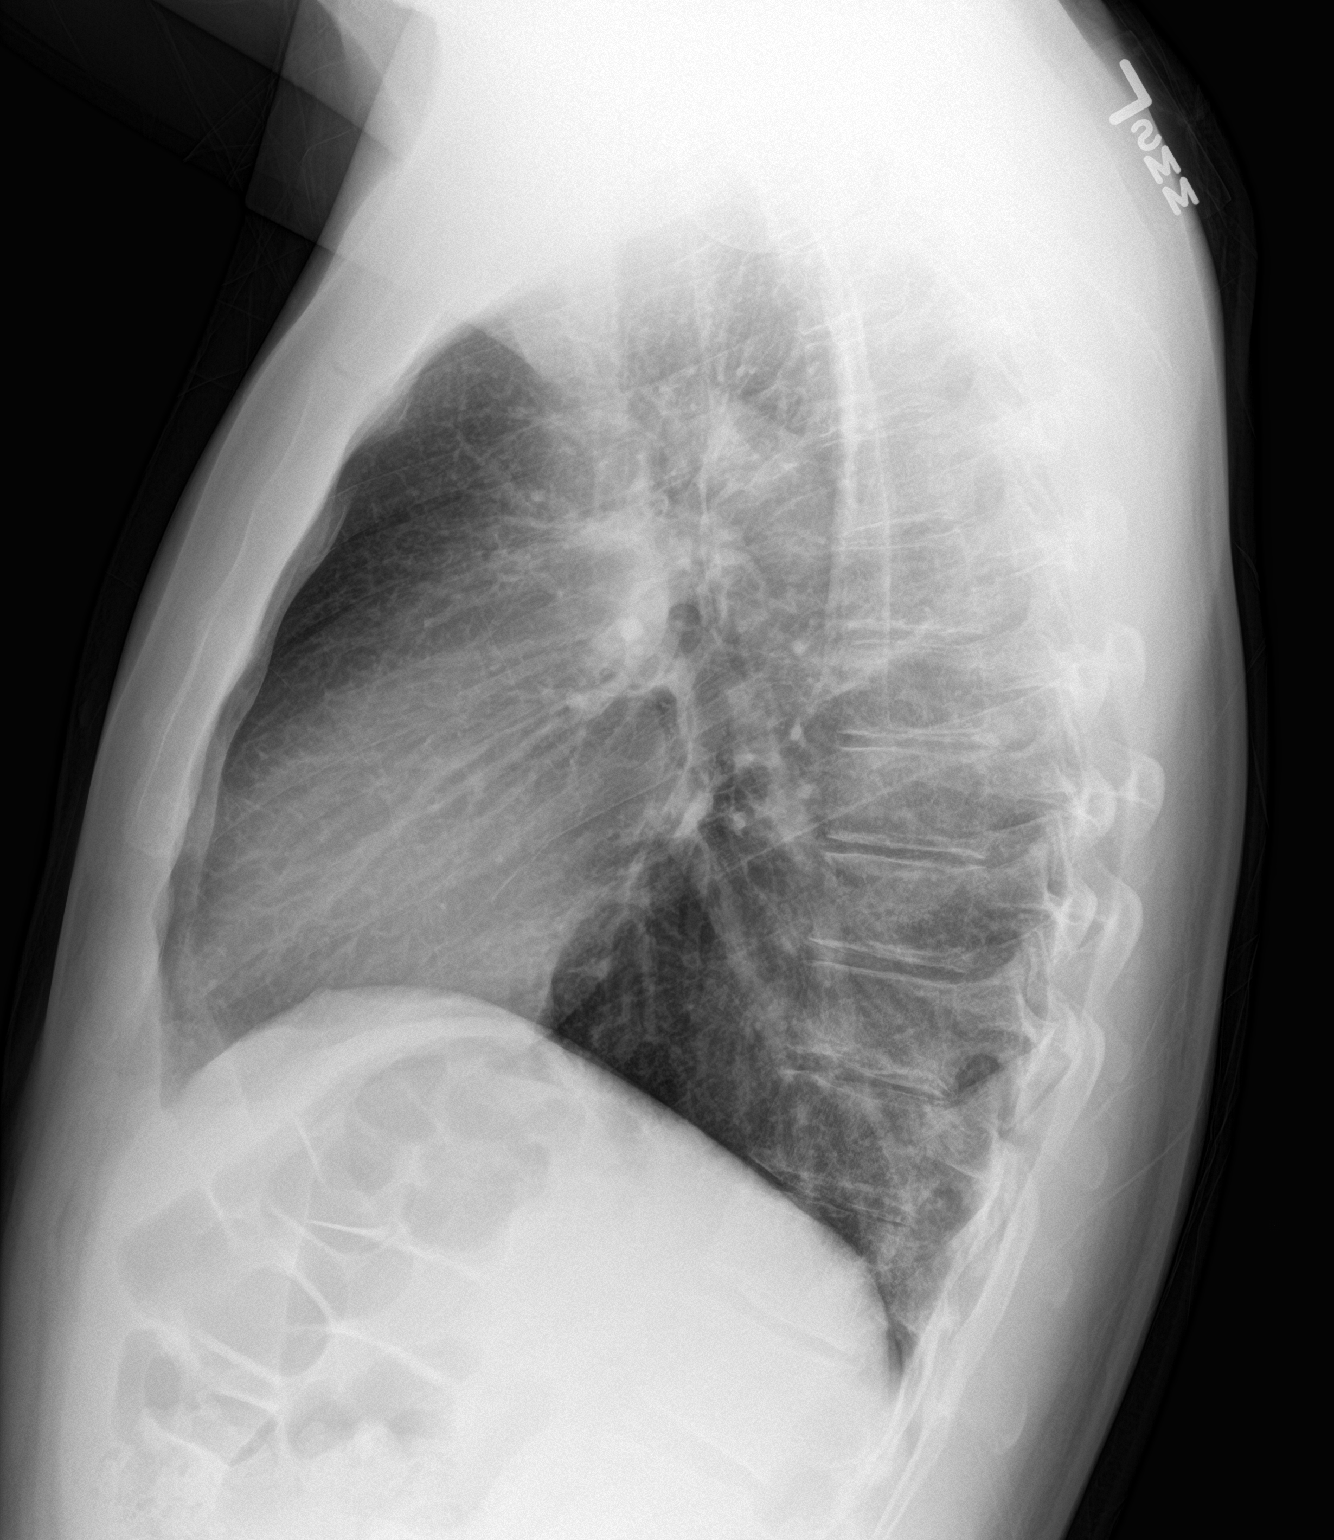

[2 of 2 positions shown; findings below may reference images not displayed]

FINDINGS: The heart size and mediastinal contours are within normal limits.
Both lungs are clear. The visualized skeletal structures are
unremarkable.
IMPRESSION: No active cardiopulmonary disease.

## 2022-06-11 DIAGNOSIS — F605 Obsessive-compulsive personality disorder: Secondary | ICD-10-CM | POA: Insufficient documentation

## 2022-08-23 DIAGNOSIS — F422 Mixed obsessional thoughts and acts: Secondary | ICD-10-CM | POA: Insufficient documentation

## 2022-12-18 DIAGNOSIS — M9905 Segmental and somatic dysfunction of pelvic region: Secondary | ICD-10-CM | POA: Diagnosis not present

## 2022-12-18 DIAGNOSIS — M9902 Segmental and somatic dysfunction of thoracic region: Secondary | ICD-10-CM | POA: Diagnosis not present

## 2022-12-18 DIAGNOSIS — M9903 Segmental and somatic dysfunction of lumbar region: Secondary | ICD-10-CM | POA: Diagnosis not present

## 2022-12-18 DIAGNOSIS — M9904 Segmental and somatic dysfunction of sacral region: Secondary | ICD-10-CM | POA: Diagnosis not present

## 2023-01-01 DIAGNOSIS — M9905 Segmental and somatic dysfunction of pelvic region: Secondary | ICD-10-CM | POA: Diagnosis not present

## 2023-01-01 DIAGNOSIS — M9904 Segmental and somatic dysfunction of sacral region: Secondary | ICD-10-CM | POA: Diagnosis not present

## 2023-01-01 DIAGNOSIS — M9902 Segmental and somatic dysfunction of thoracic region: Secondary | ICD-10-CM | POA: Diagnosis not present

## 2023-01-01 DIAGNOSIS — M9903 Segmental and somatic dysfunction of lumbar region: Secondary | ICD-10-CM | POA: Diagnosis not present

## 2023-01-08 DIAGNOSIS — M9903 Segmental and somatic dysfunction of lumbar region: Secondary | ICD-10-CM | POA: Diagnosis not present

## 2023-01-08 DIAGNOSIS — M9905 Segmental and somatic dysfunction of pelvic region: Secondary | ICD-10-CM | POA: Diagnosis not present

## 2023-01-08 DIAGNOSIS — M9902 Segmental and somatic dysfunction of thoracic region: Secondary | ICD-10-CM | POA: Diagnosis not present

## 2023-01-08 DIAGNOSIS — M9904 Segmental and somatic dysfunction of sacral region: Secondary | ICD-10-CM | POA: Diagnosis not present

## 2023-01-15 DIAGNOSIS — M9903 Segmental and somatic dysfunction of lumbar region: Secondary | ICD-10-CM | POA: Diagnosis not present

## 2023-01-15 DIAGNOSIS — M9905 Segmental and somatic dysfunction of pelvic region: Secondary | ICD-10-CM | POA: Diagnosis not present

## 2023-01-15 DIAGNOSIS — M9904 Segmental and somatic dysfunction of sacral region: Secondary | ICD-10-CM | POA: Diagnosis not present

## 2023-01-15 DIAGNOSIS — M9902 Segmental and somatic dysfunction of thoracic region: Secondary | ICD-10-CM | POA: Diagnosis not present

## 2023-01-29 DIAGNOSIS — M9902 Segmental and somatic dysfunction of thoracic region: Secondary | ICD-10-CM | POA: Diagnosis not present

## 2023-01-29 DIAGNOSIS — M9903 Segmental and somatic dysfunction of lumbar region: Secondary | ICD-10-CM | POA: Diagnosis not present

## 2023-01-29 DIAGNOSIS — M9905 Segmental and somatic dysfunction of pelvic region: Secondary | ICD-10-CM | POA: Diagnosis not present

## 2023-01-29 DIAGNOSIS — M9904 Segmental and somatic dysfunction of sacral region: Secondary | ICD-10-CM | POA: Diagnosis not present

## 2023-01-31 DIAGNOSIS — F4322 Adjustment disorder with anxiety: Secondary | ICD-10-CM | POA: Diagnosis not present

## 2023-02-14 DIAGNOSIS — M9904 Segmental and somatic dysfunction of sacral region: Secondary | ICD-10-CM | POA: Diagnosis not present

## 2023-02-14 DIAGNOSIS — M9903 Segmental and somatic dysfunction of lumbar region: Secondary | ICD-10-CM | POA: Diagnosis not present

## 2023-02-14 DIAGNOSIS — M9902 Segmental and somatic dysfunction of thoracic region: Secondary | ICD-10-CM | POA: Diagnosis not present

## 2023-02-14 DIAGNOSIS — M9905 Segmental and somatic dysfunction of pelvic region: Secondary | ICD-10-CM | POA: Diagnosis not present

## 2023-02-25 DIAGNOSIS — S60351A Superficial foreign body of right thumb, initial encounter: Secondary | ICD-10-CM | POA: Diagnosis not present

## 2023-02-25 DIAGNOSIS — M79644 Pain in right finger(s): Secondary | ICD-10-CM | POA: Diagnosis not present

## 2023-02-26 DIAGNOSIS — M9905 Segmental and somatic dysfunction of pelvic region: Secondary | ICD-10-CM | POA: Diagnosis not present

## 2023-02-26 DIAGNOSIS — M9902 Segmental and somatic dysfunction of thoracic region: Secondary | ICD-10-CM | POA: Diagnosis not present

## 2023-02-26 DIAGNOSIS — M9903 Segmental and somatic dysfunction of lumbar region: Secondary | ICD-10-CM | POA: Diagnosis not present

## 2023-02-26 DIAGNOSIS — M9904 Segmental and somatic dysfunction of sacral region: Secondary | ICD-10-CM | POA: Diagnosis not present

## 2023-02-28 DIAGNOSIS — F4322 Adjustment disorder with anxiety: Secondary | ICD-10-CM | POA: Diagnosis not present

## 2023-03-12 DIAGNOSIS — M9904 Segmental and somatic dysfunction of sacral region: Secondary | ICD-10-CM | POA: Diagnosis not present

## 2023-03-12 DIAGNOSIS — M9902 Segmental and somatic dysfunction of thoracic region: Secondary | ICD-10-CM | POA: Diagnosis not present

## 2023-03-12 DIAGNOSIS — M9903 Segmental and somatic dysfunction of lumbar region: Secondary | ICD-10-CM | POA: Diagnosis not present

## 2023-03-12 DIAGNOSIS — M9905 Segmental and somatic dysfunction of pelvic region: Secondary | ICD-10-CM | POA: Diagnosis not present

## 2023-03-26 DIAGNOSIS — M9901 Segmental and somatic dysfunction of cervical region: Secondary | ICD-10-CM | POA: Diagnosis not present

## 2023-03-26 DIAGNOSIS — M9903 Segmental and somatic dysfunction of lumbar region: Secondary | ICD-10-CM | POA: Diagnosis not present

## 2023-03-26 DIAGNOSIS — M9904 Segmental and somatic dysfunction of sacral region: Secondary | ICD-10-CM | POA: Diagnosis not present

## 2023-03-26 DIAGNOSIS — M9902 Segmental and somatic dysfunction of thoracic region: Secondary | ICD-10-CM | POA: Diagnosis not present

## 2023-04-09 DIAGNOSIS — M9904 Segmental and somatic dysfunction of sacral region: Secondary | ICD-10-CM | POA: Diagnosis not present

## 2023-04-09 DIAGNOSIS — M9901 Segmental and somatic dysfunction of cervical region: Secondary | ICD-10-CM | POA: Diagnosis not present

## 2023-04-09 DIAGNOSIS — M9903 Segmental and somatic dysfunction of lumbar region: Secondary | ICD-10-CM | POA: Diagnosis not present

## 2023-04-09 DIAGNOSIS — M9902 Segmental and somatic dysfunction of thoracic region: Secondary | ICD-10-CM | POA: Diagnosis not present

## 2023-04-29 DIAGNOSIS — M9903 Segmental and somatic dysfunction of lumbar region: Secondary | ICD-10-CM | POA: Diagnosis not present

## 2023-04-29 DIAGNOSIS — M9902 Segmental and somatic dysfunction of thoracic region: Secondary | ICD-10-CM | POA: Diagnosis not present

## 2023-04-29 DIAGNOSIS — M9901 Segmental and somatic dysfunction of cervical region: Secondary | ICD-10-CM | POA: Diagnosis not present

## 2023-04-29 DIAGNOSIS — M9904 Segmental and somatic dysfunction of sacral region: Secondary | ICD-10-CM | POA: Diagnosis not present

## 2023-05-20 DIAGNOSIS — M9901 Segmental and somatic dysfunction of cervical region: Secondary | ICD-10-CM | POA: Diagnosis not present

## 2023-05-20 DIAGNOSIS — M9902 Segmental and somatic dysfunction of thoracic region: Secondary | ICD-10-CM | POA: Diagnosis not present

## 2023-05-20 DIAGNOSIS — M9903 Segmental and somatic dysfunction of lumbar region: Secondary | ICD-10-CM | POA: Diagnosis not present

## 2023-05-20 DIAGNOSIS — M9904 Segmental and somatic dysfunction of sacral region: Secondary | ICD-10-CM | POA: Diagnosis not present

## 2023-06-10 DIAGNOSIS — M9904 Segmental and somatic dysfunction of sacral region: Secondary | ICD-10-CM | POA: Diagnosis not present

## 2023-06-10 DIAGNOSIS — M9902 Segmental and somatic dysfunction of thoracic region: Secondary | ICD-10-CM | POA: Diagnosis not present

## 2023-06-10 DIAGNOSIS — M9903 Segmental and somatic dysfunction of lumbar region: Secondary | ICD-10-CM | POA: Diagnosis not present

## 2023-06-10 DIAGNOSIS — M9905 Segmental and somatic dysfunction of pelvic region: Secondary | ICD-10-CM | POA: Diagnosis not present

## 2023-06-24 DIAGNOSIS — M9903 Segmental and somatic dysfunction of lumbar region: Secondary | ICD-10-CM | POA: Diagnosis not present

## 2023-06-24 DIAGNOSIS — M9904 Segmental and somatic dysfunction of sacral region: Secondary | ICD-10-CM | POA: Diagnosis not present

## 2023-06-24 DIAGNOSIS — M9902 Segmental and somatic dysfunction of thoracic region: Secondary | ICD-10-CM | POA: Diagnosis not present

## 2023-06-24 DIAGNOSIS — M9905 Segmental and somatic dysfunction of pelvic region: Secondary | ICD-10-CM | POA: Diagnosis not present

## 2023-07-08 DIAGNOSIS — M9902 Segmental and somatic dysfunction of thoracic region: Secondary | ICD-10-CM | POA: Diagnosis not present

## 2023-07-08 DIAGNOSIS — M9903 Segmental and somatic dysfunction of lumbar region: Secondary | ICD-10-CM | POA: Diagnosis not present

## 2023-07-08 DIAGNOSIS — M9905 Segmental and somatic dysfunction of pelvic region: Secondary | ICD-10-CM | POA: Diagnosis not present

## 2023-07-08 DIAGNOSIS — M9904 Segmental and somatic dysfunction of sacral region: Secondary | ICD-10-CM | POA: Diagnosis not present

## 2023-07-08 DIAGNOSIS — M25562 Pain in left knee: Secondary | ICD-10-CM | POA: Diagnosis not present

## 2023-07-29 DIAGNOSIS — M9903 Segmental and somatic dysfunction of lumbar region: Secondary | ICD-10-CM | POA: Diagnosis not present

## 2023-07-29 DIAGNOSIS — M9904 Segmental and somatic dysfunction of sacral region: Secondary | ICD-10-CM | POA: Diagnosis not present

## 2023-07-29 DIAGNOSIS — M9902 Segmental and somatic dysfunction of thoracic region: Secondary | ICD-10-CM | POA: Diagnosis not present

## 2023-07-29 DIAGNOSIS — M9905 Segmental and somatic dysfunction of pelvic region: Secondary | ICD-10-CM | POA: Diagnosis not present

## 2023-11-13 DIAGNOSIS — M9903 Segmental and somatic dysfunction of lumbar region: Secondary | ICD-10-CM | POA: Diagnosis not present

## 2023-11-13 DIAGNOSIS — M9902 Segmental and somatic dysfunction of thoracic region: Secondary | ICD-10-CM | POA: Diagnosis not present

## 2023-11-13 DIAGNOSIS — M9901 Segmental and somatic dysfunction of cervical region: Secondary | ICD-10-CM | POA: Diagnosis not present

## 2023-11-13 DIAGNOSIS — M546 Pain in thoracic spine: Secondary | ICD-10-CM | POA: Diagnosis not present

## 2023-11-18 DIAGNOSIS — M546 Pain in thoracic spine: Secondary | ICD-10-CM | POA: Diagnosis not present

## 2023-11-18 DIAGNOSIS — M9901 Segmental and somatic dysfunction of cervical region: Secondary | ICD-10-CM | POA: Diagnosis not present

## 2023-11-18 DIAGNOSIS — M9902 Segmental and somatic dysfunction of thoracic region: Secondary | ICD-10-CM | POA: Diagnosis not present

## 2023-11-18 DIAGNOSIS — M9903 Segmental and somatic dysfunction of lumbar region: Secondary | ICD-10-CM | POA: Diagnosis not present

## 2023-11-28 ENCOUNTER — Other Ambulatory Visit: Payer: Self-pay

## 2023-11-28 ENCOUNTER — Ambulatory Visit
Admission: EM | Admit: 2023-11-28 | Discharge: 2023-11-28 | Disposition: A | Discharge status: Home / Self Care | Payer: 59 | Attending: Nurse Practitioner | Admitting: Nurse Practitioner

## 2023-11-28 ENCOUNTER — Encounter: Payer: Self-pay | Admitting: Emergency Medicine

## 2023-11-28 DIAGNOSIS — J060 Acute laryngopharyngitis: Secondary | ICD-10-CM | POA: Diagnosis not present

## 2023-11-28 LAB — POCT RAPID STREP A (OFFICE): Rapid Strep A Screen: NEGATIVE

## 2023-11-28 MED ORDER — NYSTATIN 100000 UNIT/ML MT SUSP: 10.0000 mL | Freq: Four times a day (QID) | OROMUCOSAL | 0 refills | Status: DC | PRN

## 2023-11-28 NOTE — ED Provider Notes (Signed)
[facility]    CSN: [id number] Arrival date & time: 11/28/23  1626      History   Chief Complaint No chief complaint on file.   HPI Charles Haynes is a 32 y.o. male.   The history is provided by the patient.   Patient presents for complaints of sore throat, hoarseness, nasal congestion, and fever that started over the past 3 days.  Patient states last fever was last evening, he did not check it at home.  He denies headache, ear pain, runny nose, ear pain, cough, wheezing, difficulty breathing, chest pain, abdominal pain, nausea, vomiting, diarrhea, or rash.  Patient reports that he has been taking Tylenol and Advil for his symptoms. Past Medical History:  Diagnosis Date   COVID-19     Patient Active Problem List   Diagnosis Date Noted   MDD (major depressive disorder), recurrent severe, without psychosis (HCC)    Suicide ideation    MDD (major depressive disorder), single episode, severe (HCC) 06/27/2016    Past Surgical History:  Procedure Laterality Date   KNEE SURGERY Left 2009   WRIST SURGERY Right 2012       Home Medications    Prior to Admission medications   Medication Sig Start Date End Date Taking? Authorizing Provider  Acetaminophen (TYLENOL PO) Take by mouth.    [provider]  ibuprofen (ADVIL,MOTRIN) 200 MG tablet Take 200 mg by mouth every 6 (six) hours as needed.    [provider]    Family History Family History  Problem Relation Age of Onset   Healthy Mother    Healthy Father     Social History Social History   Tobacco Use   Smoking status: Never   Smokeless tobacco: Never  Vaping Use   Vaping status: Never Used  Substance Use Topics   Alcohol use: No   Drug use: No     Allergies   Patient has no known allergies.   Review of Systems Review of Systems Per HPI  Physical Exam Triage Vital Signs ED Triage Vitals [11/28/23 1645]  Encounter Vitals Group     BP 114/76     Systolic BP  Percentile      Diastolic BP Percentile      Pulse Rate 76     Resp 18     Temp 98.1 F (36.7 C)     Temp Source Oral     SpO2 98 %     Weight      Height      Head Circumference      Peak Flow      Pain Score 5     Pain Loc      Pain Education      Exclude from Growth Chart    No data found.  Updated Vital Signs BP 114/76 (BP Location: Right Arm)   Pulse 76   Temp 98.1 F (36.7 C) (Oral)   Resp 18   SpO2 98%   Visual Acuity Right Eye Distance:   Left Eye Distance:   Bilateral Distance:    Right Eye Near:   Left Eye Near:    Bilateral Near:     Physical Exam Vitals and nursing note reviewed.  Constitutional:      General: He is not in acute distress.    Appearance: Normal appearance.  HENT:     Head: Normocephalic.     Right Ear: Tympanic membrane, ear canal and external ear normal.     Left  Ear: Tympanic membrane, ear canal and external ear normal.     Nose: Nose normal.     Mouth/Throat:     Lips: Pink.     Mouth: Mucous membranes are moist.     Pharynx: Oropharynx is clear. Uvula midline. Posterior oropharyngeal erythema and postnasal drip present. No oropharyngeal exudate or uvula swelling.     Tonsils: 1+ on the right. 1+ on the left.     Comments: Cobblestoning present to posterior oropharynx  Eyes:     Extraocular Movements: Extraocular movements intact.     Conjunctiva/sclera: Conjunctivae normal.     Pupils: Pupils are equal, round, and reactive to light.  Cardiovascular:     Rate and Rhythm: Normal rate and regular rhythm.     Pulses: Normal pulses.     Heart sounds: Normal heart sounds.  Pulmonary:     Effort: Pulmonary effort is normal.     Breath sounds: Normal breath sounds.  Abdominal:     General: Bowel sounds are normal.     Palpations: Abdomen is soft.     Tenderness: There is no abdominal tenderness.  Musculoskeletal:     Cervical back: Normal range of motion.  Lymphadenopathy:     Cervical: No cervical adenopathy.  Skin:     General: Skin is warm and dry.  Neurological:     General: No focal deficit present.     Mental Status: He is alert and oriented to person, place, and time.  Psychiatric:        Mood and Affect: Mood normal.        Behavior: Behavior normal.      UC Treatments / Results  Labs (all labs ordered are listed, but only abnormal results are displayed) Labs Reviewed  POCT RAPID STREP A (OFFICE)    EKG   Radiology No results found.  Procedures Procedures (including critical care time)  Medications Ordered in UC Medications - No data to display  Initial Impression / Assessment and Plan / UC Course  I have reviewed the triage vital signs and the nursing notes.  Pertinent labs & imaging results that were available during my care of the patient were reviewed by me and considered in my medical decision making (see chart for details).  The rapid strep test was negative, throat culture is pending.  Magic mouthwash prescribed for throat pain and discomfort.  For his hoarseness, patient was advised to commit to voice rest.  Supportive care recommendations were provided and discussed with the patient to include over-the-counter analgesics, warm salt water rinses, a soft diet, and to monitor for worsening symptoms.  Discussed indications with the patient open follow-up will be necessary.  Patient was in agreement with this plan of care and verbalized understanding.  All questions were answered.  Patient stable for discharge.  Final Clinical Impressions(s) / UC Diagnoses   Final diagnoses:  None   Discharge Instructions   None    ED Prescriptions   None    PDMP not reviewed this encounter.   Abran Cantor, NP 11/28/23 1733

## 2023-11-28 NOTE — Discharge Instructions (Signed)
The rapid strep test was negative, a throat culture is pending.  You will be contacted if the pending test result is abnormal.  You also have access to the results via MyChart. Increase fluids and allow for plenty of rest. Continue over-the-counter Tylenol or ibuprofen as needed for pain, fever, or general discomfort. Warm salt water gargles 3-4 times daily or as needed for throat pain or discomfort. Recommend a soft diet to include soup, broth, yogurt, pudding, Jell-O, or applesauce. Recommend drinking warm teas with honey or lemon, or eating a teaspoon of honey as needed for throat pain or discomfort.  You may also try over-the-counter Chloraseptic throat spray and throat lozenges for throat pain. Voice rest to help with hoarseness/laryngitis. If symptoms have not improved over the next 7 to 10 days, or appear to be worsening, you may follow-up in this clinic or with his pediatrician for further evaluation. Follow-up as needed.

## 2023-11-28 NOTE — ED Triage Notes (Signed)
Fever on Monday, sore throat, lost voice last night.  Has been taking tylenol.

## 2023-12-01 LAB — CULTURE, GROUP A STREP (THRC)

## 2023-12-03 ENCOUNTER — Encounter: Payer: Self-pay | Admitting: Urgent Care

## 2023-12-03 ENCOUNTER — Ambulatory Visit: Payer: 59 | Admitting: Urgent Care

## 2023-12-03 VITALS — BP 120/82 | HR 70 | Temp 98.6°F | Ht 69.0 in | Wt 186.8 lb

## 2023-12-03 DIAGNOSIS — J22 Unspecified acute lower respiratory infection: Secondary | ICD-10-CM

## 2023-12-03 DIAGNOSIS — H73012 Bullous myringitis, left ear: Secondary | ICD-10-CM | POA: Diagnosis not present

## 2023-12-03 DIAGNOSIS — J040 Acute laryngitis: Secondary | ICD-10-CM | POA: Diagnosis not present

## 2023-12-03 DIAGNOSIS — K121 Other forms of stomatitis: Secondary | ICD-10-CM

## 2023-12-03 MED ORDER — AZITHROMYCIN 250 MG PO TABS: ORAL_TABLET | ORAL | 0 refills | Status: AC

## 2023-12-03 MED ORDER — PREDNISONE 10 MG (21) PO TBPK: ORAL_TABLET | Freq: Every day | ORAL | 0 refills | Status: DC

## 2023-12-03 NOTE — Patient Instructions (Signed)
Please take the azithromycin per package directions (2 pills today, followed by one pill daily for the next four days)  Please take the prednisone daily. Please take 6 pills today in a single dose, followed by 5 pills tomorrow in a single dose, etc. Best to take with breakfast. If you take too late in the afternoon, it can cause insomnia.  Please try to rest your voice as much as possible. Read the attached handout regarding laryngitis.  Return as needed, or at least once annually.

## 2023-12-03 NOTE — Progress Notes (Signed)
New Patient Office Visit  Subjective:  Patient ID: Charles Haynes, male    DOB: 30-Jan-1991  Age: 32 y.o. MRN: [id number]  CC:  Chief Complaint  Patient presents with   Establish Care    New Pt est care.Sore throat for about 10 days that includes loss of voice. He did have a fever for the first 2 days.   Discussed the use of AI software for clinical note transcription with the patient who gave verbal consent to proceed.  HPI Charles Haynes presents to establish care.  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 The fever was part of a viral illness that affected the patient's family, including two young children (3yo and 37mo). Following the resolution of the fever, the patient experienced throat discomfort and voice loss, which significantly impacted his professional duties. Despite attempts at self-medication with herbal remedies and tea, the patient's symptoms persisted, with the voice loss showing only slight improvement.  The patient also reported a persistent, albeit improving, sore throat, which was previously managed with a numbing mouthwash used primarily at night. He sought medical attention at [facility] on 11/28/23, where rapid strep and throat culture were both negative. Concurrently, the patient experienced head congestion, sinus pressure when leaning forward, and some ear discomfort with popping and decreased hearing. Despite these symptoms, the patient retained normal smell and taste functions.  The patient also reported a mild cough, producing yellow phlegm occasionally streaked with dark blood. However, there was no associated chest discomfort or history of chronic lung issues. The patient denied any shortness of breath or history of asthma, but described a sensation of labored breathing, particularly at night, characterized as feeling "cool" and necessitating a cough.  The  patient denied any nausea, vomiting, or rash. The patient's children had a similar illness, presumed viral, which resolved after seven to ten days. The patient's primary concern was the restoration of his voice due to professional requirements.   Outpatient Encounter Medications as of 12/03/2023  Medication Sig   azithromycin (ZITHROMAX) 250 MG tablet Take 2 tablets on day 1, then 1 tablet daily on days 2 through 5   magic mouthwash (nystatin, hydrocortisone, diphenhydrAMINE, lidocaine) suspension Swish and swallow 10 mLs 4 (four) times daily as needed for mouth pain.   predniSONE (STERAPRED UNI-PAK 21 TAB) 10 MG (21) TBPK tablet Take by mouth daily. Take 6 tabs by mouth daily  for 1 days, then 5 tabs for 1 days, then 4 tabs for 1 days, then 3 tabs for 1 days, 2 tabs for 1 days, then 1 tab by mouth daily for 1 days   Acetaminophen (TYLENOL PO) Take by mouth. (Patient not taking: Reported on 12/03/2023)   buPROPion (WELLBUTRIN XL) 150 MG 24 hr tablet Take by mouth. (Patient not taking: Reported on 12/03/2023)   ibuprofen (ADVIL,MOTRIN) 200 MG tablet Take 200 mg by mouth every 6 (six) hours as needed. (Patient not taking: Reported on 12/03/2023)   No facility-administered encounter medications on file as of 12/03/2023.    Past Medical History:  Diagnosis Date   COVID-19     Past Surgical History:  Procedure Laterality Date   KNEE SURGERY Left 2009   WRIST SURGERY Right 2012    Family History  Problem Relation Age of Onset   Healthy Mother    Healthy Father     Social History   Socioeconomic History   Marital  status: Married    Spouse name: Not on file   Number of children: Not on file   Years of education: Not on file   Highest education level: Not on file  Occupational History   Not on file  Tobacco Use   Smoking status: Never   Smokeless tobacco: Never  Vaping Use   Vaping status: Never Used  Substance and Sexual Activity   Alcohol use: No   Drug use: No   Sexual  activity: Not on file  Other Topics Concern   Not on file  Social History Narrative   Not on file   Social Drivers of Health   Financial Resource Strain: Not on file  Food Insecurity: No Food Insecurity (05/25/2022)   Received from [facility], [facility]   Hunger Vital Sign    Worried About Running Out of Food in the Last Year: Never true    Ran Out of Food in the Last Year: Never true  Transportation Needs: Not on file  Physical Activity: Not on file  Stress: Not on file  Social Connections: Unknown (04/24/2022)   Received [facility], [facility]   Social Network    Social Network: Not on file  Intimate Partner Violence: Unknown (03/21/2022)   Received from [facility], [facility]   HITS    Physically Hurt: Not on file    Insult or Talk Down To: Not on file    Threaten Physical Harm: Not on file    Scream or Curse: Not on file    ROS: as noted in HPI  Objective:  BP 120/82   Pulse 70   Temp 98.6 F (37 C) (Oral)   Ht 5\' 9"  (1.753 m)   Wt 186 lb 12.8 oz (84.7 kg)   SpO2 98%   BMI 27.59 kg/m   Physical Exam Vitals and nursing note reviewed.  Constitutional:      General: He is not in acute distress.    Appearance: Normal appearance. He is normal weight. He is not ill-appearing, toxic-appearing or diaphoretic.     Comments: Voice quality slightly raspy with intermittent cracking  HENT:     Head: Normocephalic and atraumatic.     Salivary Glands: Right salivary gland is not diffusely enlarged or tender. Left salivary gland is not diffusely enlarged or tender.     Right Ear: Tympanic membrane, ear canal and external ear normal. No drainage, swelling or tenderness. No middle ear effusion. There is no impacted cerumen. No foreign body. No mastoid tenderness. Tympanic membrane is not injected, scarred, perforated, erythematous or bulging.     Left Ear: No drainage, swelling or tenderness.  No middle ear effusion. There is no impacted cerumen. No foreign  body. No mastoid tenderness. Tympanic membrane is injected, erythematous and bulging (large bulla noted to superior portion).     Nose: Congestion and rhinorrhea present. Rhinorrhea is purulent.     Right Turbinates: Not enlarged or swollen.     Left Turbinates: Not enlarged or swollen.     Right Sinus: No maxillary sinus tenderness or frontal sinus tenderness.     Left Sinus: No maxillary sinus tenderness or frontal sinus tenderness.     Mouth/Throat:     Lips: Pink.     Mouth: Mucous membranes are moist. No oral lesions or angioedema.     Tongue: No lesions.     Palate: Lesions (single lesion R soft palate) present.     Pharynx: Uvula midline. Posterior oropharyngeal erythema present. No  pharyngeal swelling, oropharyngeal exudate, uvula swelling or postnasal drip.   Eyes:     General: No scleral icterus.       Right eye: No discharge.        Left eye: No discharge.     Extraocular Movements: Extraocular movements intact.     Pupils: Pupils are equal, round, and reactive to light.  Cardiovascular:     Rate and Rhythm: Normal rate and regular rhythm.     Pulses: Normal pulses.     Heart sounds: Normal heart sounds. No murmur heard.    No friction rub. No gallop.  Pulmonary:     Effort: Pulmonary effort is normal. No respiratory distress.     Breath sounds: No stridor. Rhonchi (with increased tactile fremitus and positive egophony/bronchophony to R middle lobe) present. No wheezing or rales.  Chest:     Chest wall: No tenderness.  Musculoskeletal:     Cervical back: Normal range of motion and neck supple. No rigidity or tenderness.  Lymphadenopathy:     Cervical: No cervical adenopathy.  Skin:    General: Skin is warm and dry.     Coloration: Skin is not jaundiced.     Findings: No bruising or erythema.  Neurological:     General: No focal deficit present.     Mental Status: He is alert and oriented to person, place, and time.     Motor: No weakness.     Gait: Gait normal.   Psychiatric:        Mood and Affect: Mood normal.        Behavior: Behavior normal.     Last CBC No results found for: "WBC", "HGB", "HCT", "MCV", "MCH", "RDW", "PLT" Last metabolic panel Lab Results  Component Value Date   GLUCOSE 87 06/27/2016   NA 138 06/27/2016   K 4.6 06/27/2016   CL 105 06/27/2016   CO2 26 06/27/2016   BUN 12 06/27/2016   CREATININE 0.90 06/27/2016   GFRNONAA >60 06/27/2016   CALCIUM 9.8 06/27/2016   PROT 7.9 06/27/2016   ALBUMIN 5.0 06/27/2016   BILITOT 1.1 06/27/2016   ALKPHOS 44 06/27/2016   AST 17 06/27/2016   ALT 18 06/27/2016   ANIONGAP 7 06/27/2016      Assessment & Plan:  Bullous myringitis of left ear -     Azithromycin; Take 2 tablets on day 1, then 1 tablet daily on days 2 through 5  Dispense: 6 tablet; Refill: 0  Lower respiratory infection -     Azithromycin; Take 2 tablets on day 1, then 1 tablet daily on days 2 through 5  Dispense: 6 tablet; Refill: 0  Laryngitis -     predniSONE; Take by mouth daily. Take 6 tabs by mouth daily  for 1 days, then 5 tabs for 1 days, then 4 tabs for 1 days, then 3 tabs for 1 days, 2 tabs for 1 days, then 1 tab by mouth daily for 1 days  Dispense: 21 tablet; Refill: 0  Ulcer of mouth  Assessment & Plan Upper Respiratory Infection Symptoms of sore throat, voice loss, head congestion, and cough with yellow phlegm. Noted to have a single ulcer on the soft palate and Bullous myringitis L ear. Suspected mycoplasma pneumonia given clinical presentation. -Prescribe Azithromycin (Z-Pak) for suspected mycoplasma pneumonia. -Prescribe Prednisone taper pack to reduce inflammation of the laryngx. -Advise voice rest for the next three days. -May continue hot teas  Otitis Media. -Treatment with Azithromycin should also address this issue.  Oral Ulcer Single ulcer noted on the right side of the soft palate. -Continue Magic Mouthwash for symptomatic relief if desired. -discussed triamcinolone dental paste  which was deferred due to tolerance with current treatment  Follow-up as needed.  No follow-ups on file.   Maretta Bees, PA

## 2023-12-19 ENCOUNTER — Ambulatory Visit (INDEPENDENT_AMBULATORY_CARE_PROVIDER_SITE_OTHER): Payer: 59 | Admitting: Urgent Care

## 2023-12-19 VITALS — BP 127/84 | HR 76 | Temp 98.1°F | Wt 184.0 lb

## 2023-12-19 DIAGNOSIS — J0101 Acute recurrent maxillary sinusitis: Secondary | ICD-10-CM | POA: Diagnosis not present

## 2023-12-19 DIAGNOSIS — J3489 Other specified disorders of nose and nasal sinuses: Secondary | ICD-10-CM

## 2023-12-19 DIAGNOSIS — R0981 Nasal congestion: Secondary | ICD-10-CM | POA: Diagnosis not present

## 2023-12-19 MED ORDER — FLUTICASONE PROPIONATE 50 MCG/ACT NA SUSP: 1.0000 | Freq: Two times a day (BID) | NASAL | 0 refills | Status: DC

## 2023-12-19 MED ORDER — DOXYCYCLINE HYCLATE 100 MG PO CAPS: 100.0000 mg | ORAL_CAPSULE | Freq: Two times a day (BID) | ORAL | 0 refills | Status: AC

## 2023-12-19 NOTE — Progress Notes (Signed)
 Established Patient Office Visit  Subjective:  Patient ID: Charles Haynes, male    DOB: 01-Dec-1991  Age: 33 y.o. MRN: [id number]  Chief Complaint  Patient presents with   Sinus Problem    Sinus drainage which is yellow along with congestion that started Monday night. No flu of covid testing done.    Discussed the use of AI software for clinical note transcription with the patient who gave verbal consent to proceed.   History of Present Illness The patient, previously seen for a sore throat, cough, and congestion, presents with a new onset of symptoms suggestive of a sinus infection. The symptoms started a few days ago and include sinus issues, headache, and a sensation of congestion, predominantly on the right side. The patient reports difficulty breathing during sleep due to nasal blockage and has been snoring. He also expresses concern about potential voice loss as he feels the congestion settling in the throat.  The patient has been experiencing postnasal drainage, which he describes as yellow-green in color. He has been coughing up this drainage. The patient also reports a subjective fever and has been taking Advil intermittently for pain relief. He notes a slight popping sensation in his ears but denies any ear pain.  The patient's household includes two young children who are also beginning to exhibit symptoms of a cough and sneeze. The patient is seeking proactive treatment due to work commitments in the upcoming days.  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   MDD (major depressive disorder), recurrent severe, without psychosis (HCC)    Suicide ideation    MDD (major depressive disorder), single episode, severe (HCC) 06/27/2016   Adjustment disorder with depressed mood 06/07/2016   Past Medical History:  Diagnosis Date   COVID-19     Depression    Social History   Tobacco Use   Smoking status: Never   Smokeless tobacco: Never  Vaping Use   Vaping status: Never Used  Substance Use Topics   Alcohol use: No   Drug use: No      ROS: as noted in HPI  Objective:     BP 127/84   Pulse 76   Temp 98.1 F (36.7 C) (Oral)   Wt 184 lb (83.5 kg)   SpO2 98%   BMI 27.17 kg/m  BP Readings from Last 3 Encounters:  12/19/23 127/84  12/03/23 120/82  11/28/23 114/76   Wt Readings from Last 3 Encounters:  12/19/23 184 lb (83.5 kg)  12/03/23 186 lb 12.8 oz (84.7 kg)  06/27/16 165 lb (74.8 kg)      Physical Exam Vitals and nursing note reviewed.  Constitutional:      General: He is not in acute distress.    Appearance: Normal appearance. He is not ill-appearing, toxic-appearing or diaphoretic.  HENT:     Head: Normocephalic and atraumatic.     Salivary Glands: Right salivary gland is not diffusely enlarged or tender. Left salivary gland is not diffusely enlarged or tender.     Right Ear: Tympanic membrane normal. No drainage, swelling or tenderness. No middle ear effusion. Tympanic membrane is not injected, scarred, perforated, erythematous, retracted or bulging.     Left Ear: No drainage, swelling or tenderness. A middle ear effusion is present. Tympanic membrane is not injected, scarred, perforated, erythematous, retracted or bulging.     Nose: Mucosal edema, congestion and  rhinorrhea present.     Right Nostril: Occlusion (near occlusion on R only) present.     Left Nostril: No occlusion.     Right Turbinates: Enlarged and swollen.     Left Turbinates: Swollen. Not enlarged.     Right Sinus: Maxillary sinus tenderness present. No frontal sinus tenderness.     Left Sinus: No maxillary sinus tenderness or frontal sinus tenderness.     Mouth/Throat:     Lips: Pink.     Mouth: Mucous membranes are moist.     Tongue: No lesions.     Palate: No mass.     Pharynx: Oropharynx is clear. Uvula midline. No  pharyngeal swelling, oropharyngeal exudate, posterior oropharyngeal erythema, uvula swelling or postnasal drip.  Cardiovascular:     Rate and Rhythm: Normal rate and regular rhythm.     Pulses: Normal pulses.  Pulmonary:     Effort: Pulmonary effort is normal. No respiratory distress.     Breath sounds: Normal breath sounds. No stridor. No wheezing, rhonchi or rales.  Chest:     Chest wall: No tenderness.  Musculoskeletal:     Cervical back: Normal range of motion and neck supple. No rigidity or tenderness.  Lymphadenopathy:     Cervical: No cervical adenopathy.  Skin:    General: Skin is warm and dry.     Coloration: Skin is not jaundiced.     Findings: No bruising, erythema or rash.  Neurological:     General: No focal deficit present.     Mental Status: He is alert and oriented to person, place, and time.      No results found for any visits on 12/19/23.  Last CBC No results found for: WBC, HGB, HCT, MCV, MCH, RDW, PLT Last metabolic panel Lab Results  Component Value Date   GLUCOSE 87 06/27/2016   NA 138 06/27/2016   K 4.6 06/27/2016   CL 105 06/27/2016   CO2 26 06/27/2016   BUN 12 06/27/2016   CREATININE 0.90 06/27/2016   GFRNONAA >60 06/27/2016   CALCIUM 9.8 06/27/2016   PROT 7.9 06/27/2016   ALBUMIN 5.0 06/27/2016   BILITOT 1.1 06/27/2016   ALKPHOS 44 06/27/2016   AST 17 06/27/2016   ALT 18 06/27/2016   ANIONGAP 7 06/27/2016      The ASCVD Risk score (Arnett DK, et al., 2019) failed to calculate for the following reasons:   The 2019 ASCVD risk score is only valid for ages 52 to 23  Assessment & Plan:  Acute recurrent maxillary sinusitis -     Doxycycline  Hyclate; Take 1 capsule (100 mg total) by mouth 2 (two) times daily for 7 days.  Dispense: 14 capsule; Refill: 0  Nasal congestion with rhinorrhea -     Fluticasone  Propionate; Place 1 spray into both nostrils in the morning and at bedtime.  Dispense: 16 g; Refill: 0   Assessment and  Plan Sinusitis Right-sided sinus pain, congestion, and headache for 4-5 days. Yellow-green nasal discharge. Possible fever reported. Right-sided ear fullness. Nasal examination revealed right-sided occlusion. -Start Doxycycline  BID x 7 days. -Use steam inhalation for humidification. -Use saline nasal spray for nasal irrigation. -Avoid Afrin due to risk of chronic nasal issues. -Prescribed flonase  nasal spray for use in combination with saline.  Exposure Risk Patient's daughters are starting to show symptoms of a cold. -Advise on hygiene measures to prevent further spread of infection.  No follow-ups on file.   Benton LITTIE Gave, PA

## 2023-12-19 NOTE — Patient Instructions (Signed)
 You have a sinus infection.  Please start taking the antibiotic, Doxycycline , twice daily. Take 1 hour before or two hours after any meal containing dairy.  Take it for all 7 days, do not stop early just because you feel better.   Use Flonase  twice daily to help with inflammation of the nasal passage. It is also recommended that you use nasal saline/ sinus washes to cleans the sinus passages. [employer] Leonel make a good aerosol saline spray. Hot steam from a shower or vaporizer may also be beneficial to help open up the upper airway. Eucalyptus can be helpful. Please read the attached handout on how to perform a saline sinus rinse.  Return if any new or worsening conditions.

## 2024-02-28 DIAGNOSIS — Z630 Problems in relationship with spouse or partner: Secondary | ICD-10-CM | POA: Diagnosis not present

## 2024-03-03 DIAGNOSIS — M9904 Segmental and somatic dysfunction of sacral region: Secondary | ICD-10-CM | POA: Diagnosis not present

## 2024-03-03 DIAGNOSIS — M9902 Segmental and somatic dysfunction of thoracic region: Secondary | ICD-10-CM | POA: Diagnosis not present

## 2024-03-03 DIAGNOSIS — M9903 Segmental and somatic dysfunction of lumbar region: Secondary | ICD-10-CM | POA: Diagnosis not present

## 2024-03-03 DIAGNOSIS — M9905 Segmental and somatic dysfunction of pelvic region: Secondary | ICD-10-CM | POA: Diagnosis not present

## 2024-03-23 DIAGNOSIS — M9905 Segmental and somatic dysfunction of pelvic region: Secondary | ICD-10-CM | POA: Diagnosis not present

## 2024-03-23 DIAGNOSIS — M9903 Segmental and somatic dysfunction of lumbar region: Secondary | ICD-10-CM | POA: Diagnosis not present

## 2024-03-23 DIAGNOSIS — M9904 Segmental and somatic dysfunction of sacral region: Secondary | ICD-10-CM | POA: Diagnosis not present

## 2024-03-23 DIAGNOSIS — M9902 Segmental and somatic dysfunction of thoracic region: Secondary | ICD-10-CM | POA: Diagnosis not present

## 2024-04-09 DIAGNOSIS — M9905 Segmental and somatic dysfunction of pelvic region: Secondary | ICD-10-CM | POA: Diagnosis not present

## 2024-04-09 DIAGNOSIS — M9902 Segmental and somatic dysfunction of thoracic region: Secondary | ICD-10-CM | POA: Diagnosis not present

## 2024-04-09 DIAGNOSIS — M9904 Segmental and somatic dysfunction of sacral region: Secondary | ICD-10-CM | POA: Diagnosis not present

## 2024-04-09 DIAGNOSIS — M9903 Segmental and somatic dysfunction of lumbar region: Secondary | ICD-10-CM | POA: Diagnosis not present

## 2024-04-10 DIAGNOSIS — Z630 Problems in relationship with spouse or partner: Secondary | ICD-10-CM | POA: Diagnosis not present

## 2024-04-27 DIAGNOSIS — M9903 Segmental and somatic dysfunction of lumbar region: Secondary | ICD-10-CM | POA: Diagnosis not present

## 2024-04-27 DIAGNOSIS — M9905 Segmental and somatic dysfunction of pelvic region: Secondary | ICD-10-CM | POA: Diagnosis not present

## 2024-04-27 DIAGNOSIS — M9902 Segmental and somatic dysfunction of thoracic region: Secondary | ICD-10-CM | POA: Diagnosis not present

## 2024-04-27 DIAGNOSIS — M9904 Segmental and somatic dysfunction of sacral region: Secondary | ICD-10-CM | POA: Diagnosis not present

## 2024-04-28 ENCOUNTER — Encounter: Payer: Self-pay | Admitting: Urgent Care

## 2024-04-28 ENCOUNTER — Ambulatory Visit: Payer: Self-pay | Admitting: Urgent Care

## 2024-04-28 ENCOUNTER — Ambulatory Visit (INDEPENDENT_AMBULATORY_CARE_PROVIDER_SITE_OTHER): Admitting: Urgent Care

## 2024-04-28 DIAGNOSIS — R5383 Other fatigue: Secondary | ICD-10-CM

## 2024-04-28 DIAGNOSIS — Z0000 Encounter for general adult medical examination without abnormal findings: Secondary | ICD-10-CM | POA: Diagnosis not present

## 2024-04-28 LAB — COMPREHENSIVE METABOLIC PANEL WITH GFR
ALT: 19 U/L (ref 0–53)
AST: 17 U/L (ref 0–37)
Albumin: 4.9 g/dL (ref 3.5–5.2)
Alkaline Phosphatase: 39 U/L (ref 39–117)
BUN: 11 mg/dL (ref 6–23)
CO2: 29 meq/L (ref 19–32)
Calcium: 9.8 mg/dL (ref 8.4–10.5)
Chloride: 103 meq/L (ref 96–112)
Creatinine, Ser: 0.94 mg/dL (ref 0.40–1.50)
GFR: 106.74 mL/min (ref >60.00)
Glucose, Bld: 93 mg/dL (ref 70–99)
Potassium: 4.9 meq/L (ref 3.5–5.1)
Sodium: 139 meq/L (ref 135–145)
Total Bilirubin: 0.6 mg/dL (ref 0.2–1.2)
Total Protein: 7.4 g/dL (ref 6.0–8.3)

## 2024-04-28 LAB — CBC
HCT: 45.5 % (ref 39.0–52.0)
Hemoglobin: 15.5 g/dL (ref 13.0–17.0)
MCHC: 34 g/dL (ref 30.0–36.0)
MCV: 88.5 fl (ref 78.0–100.0)
Platelets: 237 10*3/uL (ref 150.0–400.0)
RBC: 5.14 Mil/uL (ref 4.22–5.81)
RDW: 13.3 % (ref 11.5–15.5)
WBC: 6.4 10*3/uL (ref 4.0–10.5)

## 2024-04-28 LAB — LIPID PANEL
Cholesterol: 145 mg/dL (ref 0–200)
HDL: 45.8 mg/dL (ref >39.00)
LDL Cholesterol: 91 mg/dL (ref 0–99)
NonHDL: 99.03
Total CHOL/HDL Ratio: 3
Triglycerides: 41 mg/dL (ref 0.0–149.0)
VLDL: 8.2 mg/dL (ref 0.0–40.0)

## 2024-04-28 LAB — TSH: TSH: 1.09 u[IU]/mL (ref 0.35–5.50)

## 2024-04-28 LAB — VITAMIN D 25 HYDROXY (VIT D DEFICIENCY, FRACTURES): VITD: 36.98 ng/mL (ref 30.00–100.00)

## 2024-04-28 LAB — HEMOGLOBIN A1C: Hgb A1c MFr Bld: 5.2 % (ref 4.6–6.5)

## 2024-04-28 NOTE — Progress Notes (Signed)
 Annual Wellness Visit     Patient: Charles Haynes, Male    DOB: 12-04-1991, 33 y.o.   MRN: [id number]  Subjective  Chief Complaint  Patient presents with   Annual Exam    CPE pt did have coffee and a pre workout drink this morning but no food. Pt declined Tdap vaccine today.    Charles Haynes is a 33 y.o. male who presents today for his Annual Wellness Visit. He reports consuming a low carb diet. Home exercise routine includes running outdoors twice a week for about 10 minutes. He generally feels well. He reports sleeping fairly well (6-7 hours a night, takes an hour to fall asleep). He does not have additional problems to discuss today.   HPI  Does struggle with depression, feels it is well managed through counseling, supportive family, and exercise. Used to be on medication, does not feel that is warranted currently.  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   MDD (major depressive disorder), recurrent severe, without psychosis (HCC)    Suicide ideation    MDD (major depressive disorder), single episode, severe (HCC) 06/27/2016   Adjustment disorder with depressed mood 06/07/2016   Past Medical History:  Diagnosis Date   Anxiety    COVID-19    Depression    Past Surgical History:  Procedure Laterality Date   KNEE SURGERY Left 2009   WRIST SURGERY Right 2012   Social History   Tobacco Use   Smoking status: Never   Smokeless tobacco: Never  Vaping Use   Vaping status: Never Used  Substance Use Topics   Alcohol use: No   Drug use: No      Medications: Outpatient Medications Prior to Visit  Medication Sig   [DISCONTINUED] fluticasone  (FLONASE ) 50 MCG/ACT nasal spray Place 1 spray into both nostrils in the morning and at bedtime.   No facility-administered  medications prior to visit.    No Known Allergies  Patient Care Team: Mandy Second, [place] as PCP - General (Physician Assistant)  ROS Complete 12 point ROS performed with all pertinent positives listed in HPI     Objective  BP 120/83   Pulse 60   Ht 5\' 9"  (1.753 m)   Wt 179 lb (81.2 kg)   SpO2 99%   BMI 26.43 kg/m  BP Readings from Last 3 Encounters:  04/28/24 120/83  12/19/23 127/84  12/03/23 120/82   Wt Readings from Last 3 Encounters:  04/28/24 179 lb (81.2 kg)  12/19/23 184 lb (83.5 kg)  12/03/23 186 lb 12.8 oz (84.7 kg)      Physical Exam Vitals and nursing note reviewed.  Constitutional:      General: He is not in acute distress.    Appearance: Normal appearance. He is not ill-appearing, toxic-appearing or diaphoretic.  HENT:     Head: Normocephalic and atraumatic.     Right Ear: Tympanic membrane, ear canal and external ear normal. There is no impacted cerumen.     Left Ear: Tympanic membrane, ear canal and external ear normal. There is no impacted cerumen.     Nose: Nose normal.     Mouth/Throat:     Mouth: Mucous membranes are moist.     Pharynx: Oropharynx is clear. No oropharyngeal exudate or posterior oropharyngeal erythema.  Eyes:     General: No  scleral icterus.       Right eye: No discharge.        Left eye: No discharge.     Extraocular Movements: Extraocular movements intact.     Pupils: Pupils are equal, round, and reactive to light.  Neck:     Thyroid: No thyroid mass, thyromegaly or thyroid tenderness.  Cardiovascular:     Rate and Rhythm: Normal rate and regular rhythm.     Pulses: Normal pulses.     Heart sounds: No murmur heard. Pulmonary:     Effort: Pulmonary effort is normal. No respiratory distress.     Breath sounds: Normal breath sounds. No stridor. No wheezing or rhonchi.  Abdominal:     General: Abdomen is flat. Bowel sounds are normal. There is no distension.     Palpations: Abdomen is soft. There is no mass.      Tenderness: There is no abdominal tenderness. There is no guarding.  Musculoskeletal:     Cervical back: Normal range of motion and neck supple. No rigidity or tenderness.     Right lower leg: No edema.     Left lower leg: No edema.  Lymphadenopathy:     Cervical: No cervical adenopathy.  Skin:    General: Skin is warm and dry.     Coloration: Skin is not jaundiced.     Findings: No bruising, erythema or rash.  Neurological:     General: No focal deficit present.     Mental Status: He is alert and oriented to person, place, and time.     Sensory: No sensory deficit.     Motor: No weakness.  Psychiatric:        Mood and Affect: Mood normal.        Behavior: Behavior normal.     Most recent functional status assessment:     No data to display         Most recent fall risk assessment:    04/28/2024   11:23 AM  Fall Risk   Falls in the past year? 0  Number falls in past yr: 0  Injury with Fall? 0  Risk for fall due to : No Fall Risks  Follow up Falls evaluation completed    Most recent depression screenings:    04/28/2024   11:23 AM 12/19/2023   10:46 AM  PHQ 2/9 Scores  PHQ - 2 Score 2 0  PHQ- 9 Score 6 0   Most recent cognitive screening:     No data to display         Most recent Audit-C alcohol use screening    12/19/2023    5:48 AM  Alcohol Use Disorder Test (AUDIT)  1. How often do you have a drink containing alcohol? 0  3. How often do you have six or more drinks on one occasion? 0   A score of 3 or more in women, and 4 or more in men indicates increased risk for alcohol abuse, EXCEPT if all of the points are from question 1       04/28/2024   11:23 AM 12/19/2023   10:46 AM  Depression screen PHQ 2/9  Decreased Interest 1 0  Down, Depressed, Hopeless 1 0  PHQ - 2 Score 2 0  Altered sleeping 1 0  Tired, decreased energy 1 0  Change in appetite 0 0  Feeling bad or failure about yourself  1 0  Trouble concentrating 1 0  Moving slowly or  fidgety/restless 0 0  Suicidal thoughts 0 0  PHQ-9 Score 6 0  Difficult doing work/chores Not difficult at all Not difficult at all       04/28/2024   11:24 AM  GAD 7 : Generalized Anxiety Score  Nervous, Anxious, on Edge 1  Control/stop worrying 1  Worry too much - different things 1  Trouble relaxing 1  Restless 0  Easily annoyed or irritable 1  Afraid - awful might happen 1  Total GAD 7 Score 6  Anxiety Difficulty Somewhat difficult     Vision/Hearing Screen: No results found.  Last CBC No results found for: "WBC", "HGB", "HCT", "MCV", "MCH", "RDW", "PLT" Last metabolic panel Lab Results  Component Value Date   GLUCOSE 87 06/27/2016   NA 138 06/27/2016   K 4.6 06/27/2016   CL 105 06/27/2016   CO2 26 06/27/2016   BUN 12 06/27/2016   CREATININE 0.90 06/27/2016   GFRNONAA >60 06/27/2016   CALCIUM 9.8 06/27/2016   PROT 7.9 06/27/2016   ALBUMIN 5.0 06/27/2016   BILITOT 1.1 06/27/2016   ALKPHOS 44 06/27/2016   AST 17 06/27/2016   ALT 18 06/27/2016   ANIONGAP 7 06/27/2016   Last lipids No results found for: "CHOL", "HDL", "LDLCALC", "LDLDIRECT", "TRIG", "CHOLHDL" Last hemoglobin A1c No results found for: "HGBA1C" Last thyroid functions No results found for: "TSH", "T3TOTAL", "T4TOTAL", "THYROIDAB" Last vitamin D No results found for: "25OHVITD2", "25OHVITD3", "VD25OH" Last vitamin B12 and Folate No results found for: "VITAMINB12", "FOLATE"    No results found for any visits on 04/28/24.    Assessment & Plan   Annual wellness visit done today including the all of the following: Reviewed patient's Family Medical History Reviewed and updated list of patient's medical providers Assessment of cognitive impairment was done Assessed patient's functional ability Established a written schedule for health screening services Health Risk Assessent Completed and Reviewed  Exercise Activities and Dietary recommendations  Goals   Run a 10K or 1/2 marathon this  year     Immunization History  Administered Date(s) Administered   Tdap 12/19/2007    Health Maintenance  Topic Date Due   DTaP/Tdap/Td (2 - Td or Tdap) 12/18/2017   COVID-19 Vaccine (1 - 2024-25 season) 12/10/2024 (Originally 08/18/2023)   Hepatitis C Screening  04/28/2025 (Originally 02/10/2009)   HIV Screening  05/06/2025 (Originally 02/10/2006)   INFLUENZA VACCINE  07/17/2024   HPV VACCINES  Aged Out   Meningococcal B Vaccine  Aged Out     Discussed health benefits of physical activity, and encouraged him to engage in regular exercise appropriate for his age and condition.    Problem List Items Addressed This Visit   None Visit Diagnoses       Wellness examination       Relevant Orders   CBC   Comprehensive metabolic panel with GFR   Hemoglobin A1c   Lipid panel   TSH   VITAMIN D 25 Hydroxy (Vit-D Deficiency, Fractures)     Other fatigue       Relevant Orders   CBC   Comprehensive metabolic panel with GFR   Hemoglobin A1c   TSH   VITAMIN D 25 Hydroxy (Vit-D Deficiency, Fractures)      Wellness visit completed with fasting labs. Pt declined the Tdap vaccine today. Supportive measures for depression/ insomnia/ fatigue discussed.   No follow-ups on file. Return annually, sooner as needed.    Mandy Second, PA

## 2024-04-28 NOTE — Patient Instructions (Addendum)
 Please review the following non-pharmaceutical ways to help with depression:  - look into purchasing a SAD light (also known as happy light, or light box therapy). It in essence is a light that mimics the effects of sunshine, thus increasing serotonin levels. You can purchase these online, at [facility] or [employer] for around $40. - over the counter L-tryptophan. Taking 500mg  three times daily, or 1000mg  prior to bed. This medication can make you feel sleepy - additional over the counter stress relief medications --> "Boiron - Stress calm" are natural herbal tablets - essential oils, in particular lavender, can be helpful - practicing deep breathing. This helps more with stress and anxiety - caffeine and sugar are linked to depression. Drinking more water, increasing physical activity and eating a more "anti-inflammatory" diet (such as natural fruits and veggies) can help.

## 2024-05-08 DIAGNOSIS — Z630 Problems in relationship with spouse or partner: Secondary | ICD-10-CM | POA: Diagnosis not present

## 2024-05-18 DIAGNOSIS — M9904 Segmental and somatic dysfunction of sacral region: Secondary | ICD-10-CM | POA: Diagnosis not present

## 2024-05-18 DIAGNOSIS — M9903 Segmental and somatic dysfunction of lumbar region: Secondary | ICD-10-CM | POA: Diagnosis not present

## 2024-05-18 DIAGNOSIS — M9901 Segmental and somatic dysfunction of cervical region: Secondary | ICD-10-CM | POA: Diagnosis not present

## 2024-05-18 DIAGNOSIS — M9902 Segmental and somatic dysfunction of thoracic region: Secondary | ICD-10-CM | POA: Diagnosis not present

## 2024-06-08 DIAGNOSIS — M9902 Segmental and somatic dysfunction of thoracic region: Secondary | ICD-10-CM | POA: Diagnosis not present

## 2024-06-08 DIAGNOSIS — M9904 Segmental and somatic dysfunction of sacral region: Secondary | ICD-10-CM | POA: Diagnosis not present

## 2024-06-08 DIAGNOSIS — M9901 Segmental and somatic dysfunction of cervical region: Secondary | ICD-10-CM | POA: Diagnosis not present

## 2024-06-08 DIAGNOSIS — M9903 Segmental and somatic dysfunction of lumbar region: Secondary | ICD-10-CM | POA: Diagnosis not present

## 2024-06-12 DIAGNOSIS — Z630 Problems in relationship with spouse or partner: Secondary | ICD-10-CM | POA: Diagnosis not present

## 2024-06-24 DIAGNOSIS — Z630 Problems in relationship with spouse or partner: Secondary | ICD-10-CM | POA: Diagnosis not present

## 2024-06-29 DIAGNOSIS — M9903 Segmental and somatic dysfunction of lumbar region: Secondary | ICD-10-CM | POA: Diagnosis not present

## 2024-06-29 DIAGNOSIS — M9904 Segmental and somatic dysfunction of sacral region: Secondary | ICD-10-CM | POA: Diagnosis not present

## 2024-06-29 DIAGNOSIS — M9902 Segmental and somatic dysfunction of thoracic region: Secondary | ICD-10-CM | POA: Diagnosis not present

## 2024-06-29 DIAGNOSIS — M9901 Segmental and somatic dysfunction of cervical region: Secondary | ICD-10-CM | POA: Diagnosis not present

## 2024-07-20 DIAGNOSIS — M9904 Segmental and somatic dysfunction of sacral region: Secondary | ICD-10-CM | POA: Diagnosis not present

## 2024-07-20 DIAGNOSIS — M9902 Segmental and somatic dysfunction of thoracic region: Secondary | ICD-10-CM | POA: Diagnosis not present

## 2024-07-20 DIAGNOSIS — M9903 Segmental and somatic dysfunction of lumbar region: Secondary | ICD-10-CM | POA: Diagnosis not present

## 2024-07-20 DIAGNOSIS — M9901 Segmental and somatic dysfunction of cervical region: Secondary | ICD-10-CM | POA: Diagnosis not present

## 2024-07-22 DIAGNOSIS — Z630 Problems in relationship with spouse or partner: Secondary | ICD-10-CM | POA: Diagnosis not present

## 2024-07-29 DIAGNOSIS — F429 Obsessive-compulsive disorder, unspecified: Secondary | ICD-10-CM | POA: Diagnosis not present

## 2024-07-29 DIAGNOSIS — F411 Generalized anxiety disorder: Secondary | ICD-10-CM | POA: Diagnosis not present

## 2024-07-29 DIAGNOSIS — F332 Major depressive disorder, recurrent severe without psychotic features: Secondary | ICD-10-CM | POA: Diagnosis not present

## 2024-08-18 DIAGNOSIS — M9901 Segmental and somatic dysfunction of cervical region: Secondary | ICD-10-CM | POA: Diagnosis not present

## 2024-08-18 DIAGNOSIS — M9902 Segmental and somatic dysfunction of thoracic region: Secondary | ICD-10-CM | POA: Diagnosis not present

## 2024-08-18 DIAGNOSIS — M9904 Segmental and somatic dysfunction of sacral region: Secondary | ICD-10-CM | POA: Diagnosis not present

## 2024-08-18 DIAGNOSIS — M9903 Segmental and somatic dysfunction of lumbar region: Secondary | ICD-10-CM | POA: Diagnosis not present

## 2024-08-26 DIAGNOSIS — F429 Obsessive-compulsive disorder, unspecified: Secondary | ICD-10-CM | POA: Diagnosis not present

## 2024-08-26 DIAGNOSIS — F411 Generalized anxiety disorder: Secondary | ICD-10-CM | POA: Diagnosis not present

## 2024-08-26 DIAGNOSIS — F332 Major depressive disorder, recurrent severe without psychotic features: Secondary | ICD-10-CM | POA: Diagnosis not present

## 2024-09-01 DIAGNOSIS — M9902 Segmental and somatic dysfunction of thoracic region: Secondary | ICD-10-CM | POA: Diagnosis not present

## 2024-09-01 DIAGNOSIS — M9903 Segmental and somatic dysfunction of lumbar region: Secondary | ICD-10-CM | POA: Diagnosis not present

## 2024-09-01 DIAGNOSIS — M9901 Segmental and somatic dysfunction of cervical region: Secondary | ICD-10-CM | POA: Diagnosis not present

## 2024-09-01 DIAGNOSIS — M9904 Segmental and somatic dysfunction of sacral region: Secondary | ICD-10-CM | POA: Diagnosis not present

## 2024-09-02 DIAGNOSIS — Z630 Problems in relationship with spouse or partner: Secondary | ICD-10-CM | POA: Diagnosis not present

## 2024-09-21 DIAGNOSIS — M9904 Segmental and somatic dysfunction of sacral region: Secondary | ICD-10-CM | POA: Diagnosis not present

## 2024-09-21 DIAGNOSIS — M9905 Segmental and somatic dysfunction of pelvic region: Secondary | ICD-10-CM | POA: Diagnosis not present

## 2024-09-21 DIAGNOSIS — M9903 Segmental and somatic dysfunction of lumbar region: Secondary | ICD-10-CM | POA: Diagnosis not present

## 2024-09-21 DIAGNOSIS — M9902 Segmental and somatic dysfunction of thoracic region: Secondary | ICD-10-CM | POA: Diagnosis not present

## 2024-09-23 DIAGNOSIS — F429 Obsessive-compulsive disorder, unspecified: Secondary | ICD-10-CM | POA: Diagnosis not present

## 2024-09-23 DIAGNOSIS — F411 Generalized anxiety disorder: Secondary | ICD-10-CM | POA: Diagnosis not present

## 2024-09-23 DIAGNOSIS — F332 Major depressive disorder, recurrent severe without psychotic features: Secondary | ICD-10-CM | POA: Diagnosis not present

## 2024-10-07 ENCOUNTER — Telehealth: Admitting: Physician Assistant

## 2024-10-07 DIAGNOSIS — J069 Acute upper respiratory infection, unspecified: Secondary | ICD-10-CM | POA: Diagnosis not present

## 2024-10-07 MED ORDER — IPRATROPIUM BROMIDE 0.03 % NA SOLN: 2.0000 | Freq: Two times a day (BID) | NASAL | 0 refills | Status: AC

## 2024-10-07 NOTE — Progress Notes (Signed)

## 2024-10-13 DIAGNOSIS — Z630 Problems in relationship with spouse or partner: Secondary | ICD-10-CM | POA: Diagnosis not present

## 2024-10-19 DIAGNOSIS — M9904 Segmental and somatic dysfunction of sacral region: Secondary | ICD-10-CM | POA: Diagnosis not present

## 2024-10-19 DIAGNOSIS — M9903 Segmental and somatic dysfunction of lumbar region: Secondary | ICD-10-CM | POA: Diagnosis not present

## 2024-10-19 DIAGNOSIS — M9902 Segmental and somatic dysfunction of thoracic region: Secondary | ICD-10-CM | POA: Diagnosis not present

## 2024-10-19 DIAGNOSIS — M9905 Segmental and somatic dysfunction of pelvic region: Secondary | ICD-10-CM | POA: Diagnosis not present

## 2024-10-21 DIAGNOSIS — F332 Major depressive disorder, recurrent severe without psychotic features: Secondary | ICD-10-CM | POA: Diagnosis not present

## 2024-10-21 DIAGNOSIS — F429 Obsessive-compulsive disorder, unspecified: Secondary | ICD-10-CM | POA: Diagnosis not present

## 2024-10-21 DIAGNOSIS — F411 Generalized anxiety disorder: Secondary | ICD-10-CM | POA: Diagnosis not present

## 2024-11-03 DIAGNOSIS — Z630 Problems in relationship with spouse or partner: Secondary | ICD-10-CM | POA: Diagnosis not present

## 2024-11-17 DIAGNOSIS — M9905 Segmental and somatic dysfunction of pelvic region: Secondary | ICD-10-CM | POA: Diagnosis not present

## 2024-11-17 DIAGNOSIS — M9902 Segmental and somatic dysfunction of thoracic region: Secondary | ICD-10-CM | POA: Diagnosis not present

## 2024-11-17 DIAGNOSIS — M9903 Segmental and somatic dysfunction of lumbar region: Secondary | ICD-10-CM | POA: Diagnosis not present

## 2024-11-17 DIAGNOSIS — M9904 Segmental and somatic dysfunction of sacral region: Secondary | ICD-10-CM | POA: Diagnosis not present

## 2024-11-18 DIAGNOSIS — F429 Obsessive-compulsive disorder, unspecified: Secondary | ICD-10-CM | POA: Diagnosis not present

## 2024-11-18 DIAGNOSIS — F332 Major depressive disorder, recurrent severe without psychotic features: Secondary | ICD-10-CM | POA: Diagnosis not present

## 2024-11-18 DIAGNOSIS — F411 Generalized anxiety disorder: Secondary | ICD-10-CM | POA: Diagnosis not present

## 2024-11-30 DIAGNOSIS — Z630 Problems in relationship with spouse or partner: Secondary | ICD-10-CM | POA: Diagnosis not present
